# Patient Record
Sex: Female | Born: 1935 | Race: White | Hispanic: No | Marital: Married | Smoking: Former smoker
Health system: Southern US, Community
[De-identification: ages and names within clinical notes are randomized; demographics above are authoritative.]

## PROBLEM LIST (undated history)

## (undated) DIAGNOSIS — I2510 Atherosclerotic heart disease of native coronary artery without angina pectoris: Secondary | ICD-10-CM

## (undated) DIAGNOSIS — M545 Low back pain, unspecified: Secondary | ICD-10-CM

## (undated) DIAGNOSIS — E119 Type 2 diabetes mellitus without complications: Secondary | ICD-10-CM

## (undated) DIAGNOSIS — I10 Essential (primary) hypertension: Secondary | ICD-10-CM

## (undated) DIAGNOSIS — E7800 Pure hypercholesterolemia, unspecified: Secondary | ICD-10-CM

## (undated) DIAGNOSIS — E039 Hypothyroidism, unspecified: Secondary | ICD-10-CM

## (undated) HISTORY — DX: Pure hypercholesterolemia, unspecified: E78.00

## (undated) HISTORY — DX: Type 2 diabetes mellitus without complications: E11.9

## (undated) HISTORY — DX: Atherosclerotic heart disease of native coronary artery without angina pectoris: I25.10

## (undated) HISTORY — DX: Low back pain: M54.5

## (undated) HISTORY — DX: Hypothyroidism, unspecified: E03.9

## (undated) HISTORY — DX: Low back pain, unspecified: M54.50

## (undated) HISTORY — PX: CHOLECYSTECTOMY: SHX55

## (undated) HISTORY — PX: TONSILLECTOMY AND ADENOIDECTOMY: SHX28

## (undated) HISTORY — DX: Essential (primary) hypertension: I10

---

## 1997-12-25 ENCOUNTER — Encounter: Payer: Self-pay | Admitting: Neurosurgery

## 1997-12-26 ENCOUNTER — Ambulatory Visit (HOSPITAL_COMMUNITY): Admission: RE | Admit: 1997-12-26 | Discharge: 1997-12-26 | Payer: Self-pay | Admitting: Neurosurgery

## 1997-12-26 ENCOUNTER — Encounter: Payer: Self-pay | Admitting: Neurosurgery

## 1999-03-01 HISTORY — PX: CORONARY ANGIOPLASTY: SHX604

## 1999-03-31 ENCOUNTER — Inpatient Hospital Stay (HOSPITAL_COMMUNITY): Admission: EM | Admit: 1999-03-31 | Discharge: 1999-04-02 | Payer: Self-pay | Admitting: Emergency Medicine

## 1999-03-31 ENCOUNTER — Encounter: Payer: Self-pay | Admitting: Emergency Medicine

## 2000-10-10 ENCOUNTER — Encounter (HOSPITAL_BASED_OUTPATIENT_CLINIC_OR_DEPARTMENT_OTHER): Payer: Self-pay | Admitting: Internal Medicine

## 2000-10-10 ENCOUNTER — Encounter: Admission: RE | Admit: 2000-10-10 | Discharge: 2000-10-10 | Payer: Self-pay | Admitting: Internal Medicine

## 2000-12-28 ENCOUNTER — Other Ambulatory Visit: Admission: RE | Admit: 2000-12-28 | Discharge: 2000-12-28 | Payer: Self-pay | Admitting: Gastroenterology

## 2000-12-28 ENCOUNTER — Encounter (INDEPENDENT_AMBULATORY_CARE_PROVIDER_SITE_OTHER): Payer: Self-pay

## 2001-12-14 ENCOUNTER — Encounter (HOSPITAL_BASED_OUTPATIENT_CLINIC_OR_DEPARTMENT_OTHER): Payer: Self-pay | Admitting: Internal Medicine

## 2001-12-14 ENCOUNTER — Encounter: Admission: RE | Admit: 2001-12-14 | Discharge: 2001-12-14 | Payer: Self-pay | Admitting: Internal Medicine

## 2002-03-08 ENCOUNTER — Encounter: Admission: RE | Admit: 2002-03-08 | Discharge: 2002-03-08 | Payer: Self-pay | Admitting: Internal Medicine

## 2002-03-08 ENCOUNTER — Encounter (HOSPITAL_BASED_OUTPATIENT_CLINIC_OR_DEPARTMENT_OTHER): Payer: Self-pay | Admitting: Internal Medicine

## 2004-02-29 HISTORY — PX: CATARACT EXTRACTION: SUR2

## 2004-03-22 ENCOUNTER — Ambulatory Visit (HOSPITAL_COMMUNITY): Admission: RE | Admit: 2004-03-22 | Discharge: 2004-03-22 | Payer: Self-pay | Admitting: Internal Medicine

## 2004-12-28 ENCOUNTER — Ambulatory Visit (HOSPITAL_COMMUNITY): Admission: RE | Admit: 2004-12-28 | Discharge: 2004-12-28 | Payer: Self-pay | Admitting: Ophthalmology

## 2008-07-09 ENCOUNTER — Ambulatory Visit: Payer: Self-pay | Admitting: Diagnostic Radiology

## 2008-07-09 ENCOUNTER — Emergency Department (HOSPITAL_BASED_OUTPATIENT_CLINIC_OR_DEPARTMENT_OTHER): Admission: EM | Admit: 2008-07-09 | Discharge: 2008-07-09 | Payer: Self-pay | Admitting: Emergency Medicine

## 2010-05-26 ENCOUNTER — Other Ambulatory Visit: Payer: Self-pay | Admitting: Cardiovascular Disease

## 2010-05-27 NOTE — Telephone Encounter (Signed)
[facility] °

## 2010-05-27 NOTE — Telephone Encounter (Signed)
Pt was advised on last refill to make an appt.  This was not done; refill denied

## 2010-07-16 NOTE — Op Note (Signed)
[place]. [facility]  Patient:    Kathy Turner                        MRN: [id number] Proc. Date: 03/31/99 Adm. Date:  [id number] Disc. Date: [id number] Attending:  Koren Bound                           Operative Report  PROCEDURE:  Left heart catheterization with PTCA and stenting of the right coronary artery.  HISTORY: [name] is a middle-aged female who presented to the emergency room with acute chest pain.  She was found to have EKG changes, consistent with acute inferior wall myocardial infarction.  She was referred to the cardiac catheterization laboratory for further evaluation.  The procedure was left heart catheterization, with coronary angiography, with PTCA and stenting of the right coronary artery.  The right femoral artery was easily cannulated, using the modified Seldinger technique.  HEMODYNAMICS:  The LV pressure was 119/29, with an aortic pressure of 118/69.  ANGIOGRAPHY: 1. The left main coronary artery is relatively smooth and normal. 2. The left anterior descending artery has minor to moderate irregularities.    There are mid-irregularities between 10 and 30%.  The first diagonal    vessel is relatively unremarkable.  The LAD reaches to the front of 3. The left circumflex artery is a moderate-size vessel.  There are mild to    moderate irregularities between 20 and 30%.  It gives off a large    posterolateral branch. 4. The right coronary artery is seen to be an extremely large vessel and is    occluded at its midpoint.  PTCA:  The patient had been given 5000 units of heparin in the emergency room. An ACT revealed that she was adequately anticoagulated.  She was given a double bolus Integrilin drip.  The right coronary artery was engaged using a JR-4 guide.  A ______ angioplasty  wire was used to wire the RCA.  A 3.5 x 20 mm Cross Sail balloon was passed down across the stenosis.  Four inflations were  performed:  Four atmospheres for 42 seconds, five atmospheres for 7 minutes and 58 seconds, five atmospheres for 7 minutes and 58 seconds, five atmospheres for 3 minutes, and eight atmospheres for 45 seconds.  The patient had some bradycardia and hypotension, which got better with atropine and a brief infusion of dopamine.  Subsequent angiography revealed a widely patent vessel, but with some persistent irregularities.  At that point, a 3.5 x 25 mm [facility] stent was positioned down across the mid-RCA.  It was deployed at 13 atmospheres for 58 seconds.  This resulted in a nice angiographic lumen with no evidence of edge dissection.  The  stent seemed to be slightly undersized for the proximal aspect of the vessel. he Cross Sail balloon was again positioned across the proximal edge of the stent and was inflated up 16 atmospheres for 32 seconds.  This resulted in a nice angiographic lumen, with some slight flare in the proximal aspect of the stent.   The left ventriculogram was performed following the angioplasty procedure.  It reveals akinesis of the inferior wall, with well-preserved contractility of the  remaining walls.  The ejection fraction is approximately 50-60%.  There is no mitral regurgitation.  COMPLICATIONS:  None.  CONCLUSIONS:  Acute inferior wall myocardial infarction, with a successful PTCA and stenting of the right coronary  artery. DD:  04/28/99 TD:  04/28/99 Job: [id number]

## 2010-07-16 NOTE — Cardiovascular Report (Signed)
[place]. [facility]  Patient:    Kathy Turner                        MRN: [id number] Adm. Date:  [id number] Disc. Date: [id number] Attending:  Koren Bound                        Cardiac Catheterization  NO DICTATION. DD:  04/28/99 TD:  04/28/99 Job: [id number]

## 2010-07-16 NOTE — Discharge Summary (Signed)
. [facility]  Patient:    Kathy Turner                        MRN: [id number] Adm. Date:  [id number] Disc. Date: [id number] Attending:  Koren Bound                           Discharge Summary  DISCHARGE DIAGNOSES: 1. Acute inferior wall myocardial infarction - status post  percutaneous transluminal coronary angioplasty and stenting. 2. Hypertension. 3. Hypercholesterolemia. 4. Obesity.  DISCHARGE MEDICATIONS: 1. Enteric coated aspirin 325 mg a day. 2. Plavix 75 mg a day for 25 days. 3. Nitroglycerin 0.4 mg sublingual as needed. 4. Atenolol 25 mg twice a day. 5. Lipitor 20 mg q.h.s. 6. Lotensin 10 mg a day.  DISPOSITION:  The patient is to eat a low-fat, low-cholesterol diet.  She is to  watch for any signs of bleeding.  She is to see Alvia Grove., M.D., in the office in two weeks.  HISTORY OF PRESENT ILLNESS:  Kathy Turner is a 77 year old white female with a history of hypertension and hypercholesterolemia. She presented on March 31, 1999, with symptoms consistent with myocardial infarction.  She was  taken emergently to the catheterization lab for further evaluation.  HOSPITAL COURSE BY PROBLEM:  CORONARY ARTERY DISEASE.  The patient was taken to the catheterization lab where she was found to have an occluded right coronary artery. She underwent successful PTCA and stenting of her right coronary artery.  She had placement of a 3.5 mm x 25 mm [place]er Royal Stent deployed up to 3.9 mm in diameter. She had a 0% residual stenosis at the end.  She did have some transient hypotension which resolved promptly.  She did quite well and was able to walk and do her usual daily activity without any significant chest pain or dizziness.  She will be discharged on the above noted medications.  She will see Dr. Elease Hashimoto in two weeks. I have encouraged her to get a regular medical doctor. DD:  04/02/99 TD:  04/03/99 Job:  [id number]

## 2011-01-21 ENCOUNTER — Ambulatory Visit (HOSPITAL_COMMUNITY)
Admission: RE | Admit: 2011-01-21 | Discharge: 2011-01-21 | Disposition: A | Discharge status: Home / Self Care | Payer: Medicare Other | Source: Ambulatory Visit | Attending: Gastroenterology | Admitting: Gastroenterology

## 2011-01-21 DIAGNOSIS — M79609 Pain in unspecified limb: Secondary | ICD-10-CM

## 2011-01-21 DIAGNOSIS — M7989 Other specified soft tissue disorders: Secondary | ICD-10-CM

## 2011-05-16 ENCOUNTER — Encounter: Payer: Self-pay | Admitting: *Deleted

## 2011-08-02 ENCOUNTER — Encounter: Payer: Self-pay | Admitting: Cardiovascular Disease

## 2011-08-03 ENCOUNTER — Encounter: Payer: Self-pay | Admitting: Cardiovascular Disease

## 2012-06-29 ENCOUNTER — Encounter: Payer: Self-pay | Admitting: Cardiovascular Disease

## 2020-09-27 ENCOUNTER — Other Ambulatory Visit: Payer: Self-pay

## 2020-09-27 ENCOUNTER — Encounter (HOSPITAL_COMMUNITY): Payer: Self-pay | Admitting: Emergency Medicine

## 2020-09-27 ENCOUNTER — Inpatient Hospital Stay (HOSPITAL_COMMUNITY)
Admission: EM | Admit: 2020-09-27 | Discharge: 2020-10-29 | DRG: 178 | Disposition: E | Payer: Medicare HMO | Attending: Internal Medicine | Admitting: Internal Medicine

## 2020-09-27 ENCOUNTER — Emergency Department (HOSPITAL_COMMUNITY): Payer: Medicare HMO

## 2020-09-27 DIAGNOSIS — E7800 Pure hypercholesterolemia, unspecified: Secondary | ICD-10-CM | POA: Diagnosis present

## 2020-09-27 DIAGNOSIS — I959 Hypotension, unspecified: Secondary | ICD-10-CM | POA: Diagnosis present

## 2020-09-27 DIAGNOSIS — E669 Obesity, unspecified: Secondary | ICD-10-CM | POA: Diagnosis present

## 2020-09-27 DIAGNOSIS — E039 Hypothyroidism, unspecified: Secondary | ICD-10-CM | POA: Diagnosis present

## 2020-09-27 DIAGNOSIS — I10 Essential (primary) hypertension: Secondary | ICD-10-CM | POA: Diagnosis present

## 2020-09-27 DIAGNOSIS — Z9049 Acquired absence of other specified parts of digestive tract: Secondary | ICD-10-CM

## 2020-09-27 DIAGNOSIS — E860 Dehydration: Secondary | ICD-10-CM | POA: Diagnosis present

## 2020-09-27 DIAGNOSIS — E1169 Type 2 diabetes mellitus with other specified complication: Secondary | ICD-10-CM

## 2020-09-27 DIAGNOSIS — Z888 Allergy status to other drugs, medicaments and biological substances status: Secondary | ICD-10-CM

## 2020-09-27 DIAGNOSIS — E119 Type 2 diabetes mellitus without complications: Secondary | ICD-10-CM | POA: Diagnosis present

## 2020-09-27 DIAGNOSIS — Z7984 Long term (current) use of oral hypoglycemic drugs: Secondary | ICD-10-CM | POA: Diagnosis not present

## 2020-09-27 DIAGNOSIS — N179 Acute kidney failure, unspecified: Secondary | ICD-10-CM | POA: Diagnosis present

## 2020-09-27 DIAGNOSIS — I480 Paroxysmal atrial fibrillation: Secondary | ICD-10-CM | POA: Diagnosis present

## 2020-09-27 DIAGNOSIS — I4891 Unspecified atrial fibrillation: Secondary | ICD-10-CM

## 2020-09-27 DIAGNOSIS — D61818 Other pancytopenia: Secondary | ICD-10-CM | POA: Diagnosis present

## 2020-09-27 DIAGNOSIS — R531 Weakness: Secondary | ICD-10-CM | POA: Diagnosis not present

## 2020-09-27 DIAGNOSIS — Z79890 Hormone replacement therapy (postmenopausal): Secondary | ICD-10-CM

## 2020-09-27 DIAGNOSIS — Z7982 Long term (current) use of aspirin: Secondary | ICD-10-CM | POA: Diagnosis not present

## 2020-09-27 DIAGNOSIS — G9340 Encephalopathy, unspecified: Secondary | ICD-10-CM | POA: Diagnosis not present

## 2020-09-27 DIAGNOSIS — R778 Other specified abnormalities of plasma proteins: Secondary | ICD-10-CM | POA: Diagnosis present

## 2020-09-27 DIAGNOSIS — Z885 Allergy status to narcotic agent status: Secondary | ICD-10-CM

## 2020-09-27 DIAGNOSIS — I2510 Atherosclerotic heart disease of native coronary artery without angina pectoris: Secondary | ICD-10-CM | POA: Diagnosis present

## 2020-09-27 DIAGNOSIS — A419 Sepsis, unspecified organism: Secondary | ICD-10-CM

## 2020-09-27 DIAGNOSIS — Z79899 Other long term (current) drug therapy: Secondary | ICD-10-CM | POA: Diagnosis not present

## 2020-09-27 DIAGNOSIS — Z87891 Personal history of nicotine dependence: Secondary | ICD-10-CM

## 2020-09-27 DIAGNOSIS — Z66 Do not resuscitate: Secondary | ICD-10-CM | POA: Diagnosis present

## 2020-09-27 DIAGNOSIS — Z85118 Personal history of other malignant neoplasm of bronchus and lung: Secondary | ICD-10-CM

## 2020-09-27 DIAGNOSIS — C3490 Malignant neoplasm of unspecified part of unspecified bronchus or lung: Secondary | ICD-10-CM | POA: Diagnosis present

## 2020-09-27 DIAGNOSIS — R6520 Severe sepsis without septic shock: Secondary | ICD-10-CM

## 2020-09-27 DIAGNOSIS — U071 COVID-19: Secondary | ICD-10-CM | POA: Diagnosis present

## 2020-09-27 DIAGNOSIS — Z853 Personal history of malignant neoplasm of breast: Secondary | ICD-10-CM

## 2020-09-27 DIAGNOSIS — Z8249 Family history of ischemic heart disease and other diseases of the circulatory system: Secondary | ICD-10-CM

## 2020-09-27 DIAGNOSIS — R0681 Apnea, not elsewhere classified: Secondary | ICD-10-CM | POA: Diagnosis not present

## 2020-09-27 DIAGNOSIS — F0390 Unspecified dementia without behavioral disturbance: Secondary | ICD-10-CM | POA: Diagnosis present

## 2020-09-27 DIAGNOSIS — G3184 Mild cognitive impairment, so stated: Secondary | ICD-10-CM | POA: Insufficient documentation

## 2020-09-27 DIAGNOSIS — Z515 Encounter for palliative care: Secondary | ICD-10-CM | POA: Diagnosis not present

## 2020-09-27 DIAGNOSIS — R0602 Shortness of breath: Secondary | ICD-10-CM | POA: Diagnosis not present

## 2020-09-27 DIAGNOSIS — Z833 Family history of diabetes mellitus: Secondary | ICD-10-CM

## 2020-09-27 DIAGNOSIS — R4701 Aphasia: Secondary | ICD-10-CM

## 2020-09-27 DIAGNOSIS — R053 Chronic cough: Secondary | ICD-10-CM | POA: Diagnosis present

## 2020-09-27 DIAGNOSIS — Z789 Other specified health status: Secondary | ICD-10-CM | POA: Diagnosis not present

## 2020-09-27 DIAGNOSIS — E876 Hypokalemia: Secondary | ICD-10-CM

## 2020-09-27 DIAGNOSIS — Z7189 Other specified counseling: Secondary | ICD-10-CM | POA: Diagnosis not present

## 2020-09-27 DIAGNOSIS — Z9861 Coronary angioplasty status: Secondary | ICD-10-CM

## 2020-09-27 LAB — PROTIME-INR
INR: 1.1 (ref 0.8–1.2)
Prothrombin Time: 14.3 seconds (ref 11.4–15.2)

## 2020-09-27 LAB — URINALYSIS, ROUTINE W REFLEX MICROSCOPIC
Bacteria, UA: NONE SEEN
Bilirubin Urine: NEGATIVE
Glucose, UA: NEGATIVE mg/dL
Hgb urine dipstick: NEGATIVE
Ketones, ur: NEGATIVE mg/dL
Nitrite: NEGATIVE
Protein, ur: NEGATIVE mg/dL
Specific Gravity, Urine: 1.017 (ref 1.005–1.030)
pH: 5 (ref 5.0–8.0)

## 2020-09-27 LAB — COMPREHENSIVE METABOLIC PANEL
ALT: 7 U/L (ref 0–44)
AST: 16 U/L (ref 15–41)
Albumin: 2.5 g/dL — ABNORMAL LOW (ref 3.5–5.0)
Alkaline Phosphatase: 76 U/L (ref 38–126)
Anion gap: 6 (ref 5–15)
BUN: 15 mg/dL (ref 8–23)
CO2: 22 mmol/L (ref 22–32)
Calcium: 7.4 mg/dL — ABNORMAL LOW (ref 8.9–10.3)
Chloride: 109 mmol/L (ref 98–111)
Creatinine, Ser: 1.49 mg/dL — ABNORMAL HIGH (ref 0.44–1.00)
GFR, Estimated: 34 mL/min — ABNORMAL LOW (ref >60)
Glucose, Bld: 111 mg/dL — ABNORMAL HIGH (ref 70–99)
Potassium: 3.3 mmol/L — ABNORMAL LOW (ref 3.5–5.1)
Sodium: 137 mmol/L (ref 135–145)
Total Bilirubin: 0.3 mg/dL (ref 0.3–1.2)
Total Protein: 4.7 g/dL — ABNORMAL LOW (ref 6.5–8.1)

## 2020-09-27 LAB — PROCALCITONIN: Procalcitonin: <0.1 ng/mL

## 2020-09-27 LAB — LACTIC ACID, PLASMA
Lactic Acid, Venous: 0.9 mmol/L (ref 0.5–1.9)
Lactic Acid, Venous: 0.6 mmol/L (ref 0.5–1.9)

## 2020-09-27 LAB — CBC WITH DIFFERENTIAL/PLATELET
Abs Immature Granulocytes: 0.01 10*3/uL (ref 0.00–0.07)
Basophils Absolute: 0 10*3/uL (ref 0.0–0.1)
Basophils Relative: 0 %
Eosinophils Absolute: 0 10*3/uL (ref 0.0–0.5)
Eosinophils Relative: 0 %
HCT: 32.6 % — ABNORMAL LOW (ref 36.0–46.0)
Hemoglobin: 10.3 g/dL — ABNORMAL LOW (ref 12.0–15.0)
Immature Granulocytes: 0 %
Lymphocytes Relative: 22 %
Lymphs Abs: 0.6 10*3/uL — ABNORMAL LOW (ref 0.7–4.0)
MCH: 31.3 pg (ref 26.0–34.0)
MCHC: 31.6 g/dL (ref 30.0–36.0)
MCV: 99.1 fL (ref 80.0–100.0)
Monocytes Absolute: 0.3 10*3/uL (ref 0.1–1.0)
Monocytes Relative: 12 %
Neutro Abs: 1.8 10*3/uL (ref 1.7–7.7)
Neutrophils Relative %: 66 %
Platelets: 141 10*3/uL — ABNORMAL LOW (ref 150–400)
RBC: 3.29 MIL/uL — ABNORMAL LOW (ref 3.87–5.11)
RDW: 14 % (ref 11.5–15.5)
WBC: 2.8 10*3/uL — ABNORMAL LOW (ref 4.0–10.5)
nRBC: 0 % (ref 0.0–0.2)

## 2020-09-27 LAB — FIBRINOGEN: Fibrinogen: 434 mg/dL (ref 210–475)

## 2020-09-27 LAB — BRAIN NATRIURETIC PEPTIDE: B Natriuretic Peptide: 118.5 pg/mL — ABNORMAL HIGH (ref 0.0–100.0)

## 2020-09-27 LAB — RESP PANEL BY RT-PCR (FLU A&B, COVID) ARPGX2
Influenza A by PCR: NEGATIVE
Influenza B by PCR: NEGATIVE
SARS Coronavirus 2 by RT PCR: POSITIVE — AB

## 2020-09-27 LAB — FERRITIN: Ferritin: 578 ng/mL — ABNORMAL HIGH (ref 11–307)

## 2020-09-27 LAB — C-REACTIVE PROTEIN: CRP: 1.7 mg/dL — ABNORMAL HIGH (ref <1.0)

## 2020-09-27 LAB — TROPONIN I (HIGH SENSITIVITY)
Troponin I (High Sensitivity): 27 ng/L — ABNORMAL HIGH (ref <18)
Troponin I (High Sensitivity): 112 ng/L (ref <18)

## 2020-09-27 LAB — D-DIMER, QUANTITATIVE: D-Dimer, Quant: 2.05 ug/mL-FEU — ABNORMAL HIGH (ref 0.00–0.50)

## 2020-09-27 LAB — TRIGLYCERIDES: Triglycerides: 119 mg/dL (ref <150)

## 2020-09-27 LAB — LACTATE DEHYDROGENASE: LDH: 131 U/L (ref 98–192)

## 2020-09-27 LAB — MAGNESIUM: Magnesium: 1.4 mg/dL — ABNORMAL LOW (ref 1.7–2.4)

## 2020-09-27 MED ORDER — LACTATED RINGERS IV BOLUS
1000.0000 mL | Freq: Once | INTRAVENOUS | Status: AC
  Administered 2020-09-27: 1000 mL via INTRAVENOUS

## 2020-09-27 MED ORDER — LACTATED RINGERS IV SOLN: INTRAVENOUS | Status: DC

## 2020-09-27 MED ORDER — LACTATED RINGERS IV BOLUS
500.0000 mL | Freq: Once | INTRAVENOUS | Status: AC
  Administered 2020-09-27: 500 mL via INTRAVENOUS

## 2020-09-27 MED ORDER — SODIUM CHLORIDE 0.9 % IV SOLN
100.0000 mg | Freq: Every day | INTRAVENOUS | Status: DC
  Administered 2020-09-28: 100 mg via INTRAVENOUS
  Filled 2020-09-27: qty 20

## 2020-09-27 MED ORDER — SODIUM CHLORIDE 0.9 % IV SOLN
1.0000 g | Freq: Once | INTRAVENOUS | Status: AC
  Administered 2020-09-27: 1 g via INTRAVENOUS
  Filled 2020-09-27: qty 10

## 2020-09-27 MED ORDER — SODIUM CHLORIDE 0.9 % IV SOLN
500.0000 mg | Freq: Once | INTRAVENOUS | Status: AC
  Administered 2020-09-27: 500 mg via INTRAVENOUS
  Filled 2020-09-27: qty 500

## 2020-09-27 MED ORDER — POTASSIUM CHLORIDE CRYS ER 20 MEQ PO TBCR
20.0000 meq | EXTENDED_RELEASE_TABLET | Freq: Once | ORAL | Status: AC
  Administered 2020-09-28: 20 meq via ORAL
  Filled 2020-09-27: qty 1

## 2020-09-27 MED ORDER — SODIUM CHLORIDE 0.9 % IV SOLN
200.0000 mg | Freq: Once | INTRAVENOUS | Status: AC
  Administered 2020-09-27: 200 mg via INTRAVENOUS
  Filled 2020-09-27: qty 40

## 2020-09-27 NOTE — ED Notes (Signed)
PT chucks and brief changed. Meds given as ordered and labs drawn

## 2020-09-27 NOTE — ED Provider Notes (Signed)
[facility] EMERGENCY DEPARTMENT Provider Note  CSN: [id number] Arrival date & time: 09/26/2020 1436    History Chief Complaint  Patient presents with   Loss of Consciousness    X3     Kathy Turner is a 87 y.o. female with history of breast cancer, lung cancer, HTN, DM, HLD, dementia whose husband recently died suddenly that has moved in with her daughter who provides the history. Patient recently stopped Keytruda for her lung cancer as it was no longer effective. She was in her usual state of health recently but began to complain of weakness yesterday afternoon, She was lowered to the ground by her daughter who reports she was having trouble speaking. This was at 2pm yesterday. She eventually seemed to get better and was able to get up. She has had two more syncopal episodes earlier today, continues to have difficulty speaking. She has not had any fever, nasuea or vomiting. Has not eating or drank much today but had her usual appetite otherwise. Daughter noted her BP to be low, her HR to be labile. She was not complaining of any chest pains recently. She has chronic cough from her lung cancer, but no change.  After she was still doing poorly today the daughter called EMS, they found her to be in rapid afib and gave her a 10mg  bolus of diltiazem which improved her rate but her BP was then low.  Patient is unable to provide any useful history.  Per Daughter, patient is DNR.    Past Medical History:  Diagnosis Date   CAD (coronary artery disease)    DM (diabetes mellitus) ([place])    HTN (hypertension)    Hypercholesterolemia    Hypothyroidism    Low back pain     Past Surgical History:  Procedure Laterality Date   CATARACT EXTRACTION  2006   CHOLECYSTECTOMY     CORONARY ANGIOPLASTY  03/1999   TONSILLECTOMY AND ADENOIDECTOMY      Family History  Problem Relation Age of Onset   Diabetes Mother    Heart failure Mother     Social History   Tobacco Use   Smoking status:  Former    Types: Cigarettes   Smokeless tobacco: Never   Tobacco comments:    quit Jan 2001  Substance Use Topics   Alcohol use: Never     Home Medications Prior to Admission medications   Medication Sig Start Date End Date Taking? Authorizing Provider  aspirin 81 MG tablet Take 81 mg by mouth daily.    [provider]  atenolol (TENORMIN) 25 MG tablet TAKE 1 TABLET BY MOUTH TWICE A DAY 05/26/10   Nahser, Wonda Cheng, MD  dorzolamide-timolol (COSOPT) 22.3-6.8 MG/ML ophthalmic solution 1 drop 2 (two) times daily.    [provider]  furosemide (LASIX) 20 MG tablet Take 20 mg by mouth 2 (two) times daily.    [provider]  gemfibrozil (LOPID) 600 MG tablet Take 600 mg by mouth 2 (two) times daily before a meal.    [provider]  HYDROcodone-acetaminophen (VICODIN) 5-500 MG per tablet Take 1 tablet by mouth every 6 (six) hours as needed.    [provider]  levothyroxine (LEVOTHROID) 75 MCG tablet Take 1 tab on one day then 2 tabs 6 days of the week    [provider]  Lysine HCl (L-FORMULA LYSINE HCL) 500 MG TABS Take by mouth. Take 1 tab twice a day    [provider]  metFORMIN (  GLUCOPHAGE-XR) 500 MG 24 hr tablet Take 500 mg by mouth daily with breakfast.    [provider]  potassium chloride (K-DUR) 10 MEQ tablet Take 10 mEq by mouth once.    [provider]  ranitidine (ZANTAC) 150 MG capsule Take 150 mg by mouth 2 (two) times daily.    [provider]  simvastatin (ZOCOR) 40 MG tablet Take 40 mg by mouth every evening.    [provider]  spironolactone (ALDACTONE) 50 MG tablet Take 50 mg by mouth daily.    [provider]  vitamin E (VITAMIN E) 400 UNIT capsule Take 400 Units by mouth 2 (two) times daily.    [provider]     Allergies    Ramipril   Review of Systems   Review of Systems Unable to assess due to mental status.    Physical Exam BP 99/86    Pulse 71   Temp 99.2 F (37.3 C) (Oral)   Resp 15   SpO2 96%   Physical Exam Vitals and nursing note reviewed.  Constitutional:      Appearance: Normal appearance.  HENT:     Head: Normocephalic and atraumatic.     Nose: Nose normal.     Mouth/Throat:     Mouth: Mucous membranes are dry.  Eyes:     Extraocular Movements: Extraocular movements intact.     Conjunctiva/sclera: Conjunctivae normal.  Cardiovascular:     Rate and Rhythm: Normal rate.  Pulmonary:     Effort: Pulmonary effort is normal.     Breath sounds: Normal breath sounds.  Abdominal:     General: Abdomen is flat.     Palpations: Abdomen is soft.     Tenderness: There is no abdominal tenderness.  Musculoskeletal:        General: Normal range of motion.     Cervical back: Neck supple.     Right lower leg: Edema present.     Left lower leg: Edema present.  Skin:    General: Skin is warm and dry.  Neurological:     Mental Status: She is disoriented.     Cranial Nerves: No cranial nerve deficit.     Motor: Weakness (globally) present.     Comments: Expressive aphasia/garbled speech  Psychiatric:        Mood and Affect: Mood normal.     ED Results / Procedures / Treatments   Labs (all labs ordered are listed, but only abnormal results are displayed) Labs Reviewed  RESP PANEL BY RT-PCR (FLU A&B, COVID) ARPGX2 - Abnormal; Notable for the following components:      Result Value   SARS Coronavirus 2 by RT PCR POSITIVE (*)    All other components within normal limits  COMPREHENSIVE METABOLIC PANEL - Abnormal; Notable for the following components:   Potassium 3.3 (*)    Glucose, Bld 111 (*)    Creatinine, Ser 1.49 (*)    Calcium 7.4 (*)    Total Protein 4.7 (*)    Albumin 2.5 (*)    GFR, Estimated 34 (*)    All other components within normal limits  URINALYSIS, ROUTINE W REFLEX MICROSCOPIC - Abnormal; Notable for the following components:   APPearance CLOUDY (*)    Leukocytes,Ua SMALL (*)    All other  components within normal limits  CBC WITH DIFFERENTIAL/PLATELET - Abnormal; Notable for the following components:   WBC 2.8 (*)    RBC 3.29 (*)    Hemoglobin 10.3 (*)  HCT 32.6 (*)    Platelets 141 (*)    Lymphs Abs 0.6 (*)    All other components within normal limits  BRAIN NATRIURETIC PEPTIDE - Abnormal; Notable for the following components:   B Natriuretic Peptide 118.5 (*)    All other components within normal limits  TROPONIN I (HIGH SENSITIVITY) - Abnormal; Notable for the following components:   Troponin I (High Sensitivity) 27 (*)    All other components within normal limits  CULTURE, BLOOD (ROUTINE X 2)  CULTURE, BLOOD (ROUTINE X 2)  LACTIC ACID, PLASMA  PROTIME-INR  LACTIC ACID, PLASMA  LACTATE DEHYDROGENASE  FERRITIN  FIBRINOGEN  C-REACTIVE PROTEIN  D-DIMER, QUANTITATIVE  PROCALCITONIN  MAGNESIUM  TRIGLYCERIDES  TROPONIN I (HIGH SENSITIVITY)    EKG EKG Interpretation  Date/Time:  Sunday September 27 2020 14:52:26 EDT Ventricular Rate:  90 PR Interval:    QRS Duration: 100 QT Interval:  373 QTC Calculation: 457 R Axis:   75 Text Interpretation: Atrial fibrillation Consider anterior infarct Since last tracing Atrial fibrillation has replaced Normal sinus rhythm Confirmed by Calvert Cantor [phone]) on 09/01/2020 3:36:06 PM  Radiology CT Head Wo Contrast  Result Date: 09/12/2020 CLINICAL DATA:  Neuro deficit, acute, stroke suspected Syncopal episodes. EXAM: CT HEAD WITHOUT CONTRAST TECHNIQUE: Contiguous axial images were obtained from the base of the skull through the vertex without intravenous contrast. COMPARISON:  None. FINDINGS: Brain: No hemorrhage. No evidence of acute ischemia. Moderate generalized atrophy. Periventricular white matter hypodensity typical of chronic small vessel ischemia, moderate for age. 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 Scattered calcifications in the right cerebral hemisphere suggesting prior infection or  inflammatory process. No subdural or extra-axial collection. No midline shift or mass lesion/mass effect. Basilar cisterns are patent. Partially empty sella. Vascular: No hyperdense vessel.  Mild basilar Dolichoectasia. Skull: No fracture or focal lesion. Sinuses/Orbits: Defect of the right lamina papyracea no herniation of fat appears chronic, there is no adjacent inflammation. Bilateral cataract resection. No mastoid effusion. Other: None. IMPRESSION: 1. No acute intracranial abnormality. 2. Generalized atrophy and chronic small vessel ischemia. 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 3. Scattered calcifications in the right cerebral hemisphere suggesting prior infection or inflammatory process. Electronically Signed   By: Keith Rake M.D.   On: 09/16/2020 17:46   DG Chest Port 1 View  Result Date: 09/22/2020 CLINICAL DATA:  Syncope, shortness of breath EXAM: PORTABLE CHEST 1 VIEW COMPARISON:  None. FINDINGS: Right Port-A-Cath is in place with the tip at the cavoatrial junction. Heart is borderline in size. Diffuse interstitial prominence and perihilar/lower lobe opacities. No effusions. No acute bony abnormality. IMPRESSION: Diffuse interstitial prominence and perihilar opacities could reflect edema or infection. Electronically Signed   By: Rolm Baptise M.D.   On: 09/21/2020 15:18    Procedures .Critical Care  Date/Time: 09/02/2020 8:58 PM Performed by: Truddie Hidden, MD Authorized by: Truddie Hidden, MD   Critical care provider statement:    Critical care time (minutes):  45   Critical care time was exclusive of:  Separately billable procedures and treating other patients   Critical care was necessary to treat or prevent imminent or life-threatening deterioration of the following conditions:  Sepsis and shock   Critical care was time spent personally by me on the following activities:  Discussions with consultants, evaluation of patient's response to treatment,  examination of patient, ordering and performing treatments and interventions, ordering and review of laboratory studies, ordering and review of radiographic  studies, pulse oximetry, re-evaluation of patient's condition, obtaining history from patient or surrogate and review of old charts   Care discussed with: admitting provider    Medications Ordered in the ED Medications  lactated ringers infusion ( Intravenous New Bag/Given 09/06/2020 2014)  remdesivir 200 mg in sodium chloride 0.9% 250 mL IVPB (has no administration in time range)    Followed by  remdesivir 100 mg in sodium chloride 0.9 % 100 mL IVPB (has no administration in time range)  potassium chloride SA (KLOR-CON) CR tablet 20 mEq (has no administration in time range)  lactated ringers bolus 1,000 mL (0 mLs Intravenous Stopped 09/12/2020 1552)  cefTRIAXone (ROCEPHIN) 1 g in sodium chloride 0.9 % 100 mL IVPB (0 g Intravenous Stopped 09/26/2020 1921)  azithromycin (ZITHROMAX) 500 mg in sodium chloride 0.9 % 250 mL IVPB (0 mg Intravenous Stopped 09/14/2020 1933)  lactated ringers bolus 500 mL (500 mLs Intravenous New Bag/Given 09/11/2020 2018)     MDM Rules/Calculators/A&P MDM Patient with new onset afib has not converted back to NSR on monitor. Patient's daughter is managing meds and is sure she hasn't gotten any extra, actually hasn't had metoprolol in 2 days. She has new onset expressive aphasia, however symptoms started at 2pm yesterday, so outside window for Code Stroke and not a candidate for tPA. BP has been persistently low in the ED even after an initial LR bolus, will give one additional and reassess. Consider sepsis, hypovolemia, cardiogenic. Labs and imaging pending.   ED Course  I have reviewed the triage vital signs and the nursing notes.  Pertinent labs & imaging results that were available during my care of the patient were reviewed by me and considered in my medical decision making (see chart for details).  Clinical Course as of  09/21/2020 2058  Nancy Fetter Sep 27, 2020  1637 CMP shows increased Cr from baseline 0.96 at [place] recently.  [CS]  0488 CBC shows lymphopenia, last WBC at [facility] 5.8. Given low BP and low WBC with CXR concerning for infiltrate, will initiate IV Abx. Continue with IVF resuscitation.  [CS]  1638 Lactic acid is neg.  [CS]  8916 BP is improving.  [CS]  9450 UA with some WBC, already getting Abx. Will discuss with admitting team.  [CS]  [phone] Per Daughter patient is DNR.  [CS]  2800 Covid is positive. Accounts for her CXR findings.  [CS]  [place] with Dr. Beryle Quant, hospitalist, who will evaluate for admission. Requests we add covid inflammatory markers, begin remdesivir.  [CS]    Clinical Course User Index [CS] Truddie Hidden, MD    Final Clinical Impression(s) / ED Diagnoses Final diagnoses:  COVID-19  Hypotension, unspecified hypotension type  Sepsis with encephalopathy without septic shock, due to unspecified organism [facility])  Atrial fibrillation, unspecified type [facility])    Rx / DC Orders ED Discharge Orders     None        Truddie Hidden, MD 09/25/2020 2059

## 2020-09-27 NOTE — ED Notes (Signed)
Report given to Garlan Fillers, RN

## 2020-09-27 NOTE — H&P (Signed)
History and Physical    Kathy Turner YFV:[id number] DOB: [birth year] DOA: 09/13/2020  PCP: Jettie Booze, NP   Patient coming from: Home  Chief Complaint: Weakness, low blood pressure, difficulty speaking  HPI: Kathy Turner is a 87 y.o. female with medical history significant for CAD, HTN, DMT2, hypothyroidism, Lung cancer followed by oncology, dementia who presents by EMS for evaluation of generalized weakness and low blood pressure.  Her husband died suddenly last month and patient is now living with her daughter.  Daughter reports that patient was recently taken off of Keytruda for her lung cancer due to it no longer being effective.  Ms. Murfin has had increased weakness and difficulty getting around the house since yesterday.  Daughter reports that she had lowered her to the floor yesterday afternoon due to profound weakness.  He also states that her mother's had difficulty forming words and expressing herself which was not her normal baseline level.  She seemed to get better after a short time of rest.  This morning she had worsening weakness and not able to get around on her own at all like she normally would.  Daughter took her blood pressure and noted it was 80/60 at home so EMS was called.  Patient has not had any fever, nausea vomiting or diarrhea.  She does not complain of chest pain.  She reports she does not drink much fluid yesterday but otherwise her appetite has been normal.  Daughter reports she has a chronic cough from her lung cancer which is unchanged and nonproductive.  Daughter states that her mother has been in the home and has not gone out except for 2 appointments at the oncology office with the last 1 being last Wednesday.  Daughter states that only her and her husband as well as patient's physical therapist and home health nurse come to the house to see her otherwise she has not been in public places. She has been vaccinated and boosted for COVID-19. When EMS first  arrived patient was found to have a low blood pressure and have atrial fibrillation with RVR with a heart rate reportedly in the 170s.  She was given 10 mg bolus of diltiazem and her heart rate improved and she was brought to the hospital.  Is a history of smoking but quit over 25 years ago.  No alcohol or illicit drug use.  ED Course: In the emergency room patient was first noted to be in atrial fibrillation which spontaneously resolved.  Patient was noted to have a low blood pressure of 70-80/45-55 which improved with IVF hydration.  Code stroke was not initiated as patient had been in over 24 hours since symptoms began.  CT of the head showed no acute intracranial pathology.  He was given dose of Rocephin and azithromycin with mild diffuse social prominence in the lungs on chest x-ray.  Patient was found to be COVID-19 positive on testing.  Labs revealed a troponin of 27 and a BNP of 118.  Lactic acid 0.9.  WBCs 2800, hemoglobin 10.3, hematocrit 32.6, platelets 141,000.  Sodium 137, potassium 3.3, chloride 109, bicarb 22, creatinine 1.49, BUN 15, alkaline phosphatase 76, AST 16, ALT 7, glucose 111.  Patient was given remdesivir in the emergency room.  She has been maintaining O2 sats in the 96 to 98% range on room air and has not required any supplemental oxygen.  Hospitalist service been asked to admit for further management  Review of Systems:  Unable to obtain accurate review  of systems secondary to patient's MCI and expressive aphasia  Past Medical History:  Diagnosis Date   CAD (coronary artery disease)    DM (diabetes mellitus) ([place])    HTN (hypertension)    Hypercholesterolemia    Hypothyroidism    Low back pain     Past Surgical History:  Procedure Laterality Date   CATARACT EXTRACTION  2006   CHOLECYSTECTOMY     CORONARY ANGIOPLASTY  03/1999   TONSILLECTOMY AND ADENOIDECTOMY      Social History  reports that she has quit smoking. Her smoking use included cigarettes. She has  never used smokeless tobacco. She reports that she does not drink alcohol. No history on file for drug use.  Allergies  Allergen Reactions   Ramipril     cough    Family History  Problem Relation Age of Onset   Diabetes Mother    Heart failure Mother      Prior to Admission medications   Medication Sig Start Date End Date Taking? Authorizing Provider  aspirin 81 MG tablet Take 81 mg by mouth daily.    [provider]  atenolol (TENORMIN) 25 MG tablet TAKE 1 TABLET BY MOUTH TWICE A DAY 05/26/10   Nahser, Wonda Cheng, MD  dorzolamide-timolol (COSOPT) 22.3-6.8 MG/ML ophthalmic solution 1 drop 2 (two) times daily.    [provider]  furosemide (LASIX) 20 MG tablet Take 20 mg by mouth 2 (two) times daily.    [provider]  gemfibrozil (LOPID) 600 MG tablet Take 600 mg by mouth 2 (two) times daily before a meal.    [provider]  HYDROcodone-acetaminophen (VICODIN) 5-500 MG per tablet Take 1 tablet by mouth every 6 (six) hours as needed.    [provider]  levothyroxine (LEVOTHROID) 75 MCG tablet Take 1 tab on one day then 2 tabs 6 days of the week    [provider]  Lysine HCl (L-FORMULA LYSINE HCL) 500 MG TABS Take by mouth. Take 1 tab twice a day    [provider]  metFORMIN (GLUCOPHAGE-XR) 500 MG 24 hr tablet Take 500 mg by mouth daily with breakfast.    [provider]  potassium chloride (K-DUR) 10 MEQ tablet Take 10 mEq by mouth once.    [provider]  ranitidine (ZANTAC) 150 MG capsule Take 150 mg by mouth 2 (two) times daily.    [provider]  simvastatin (ZOCOR) 40 MG tablet Take 40 mg by mouth every evening.    [provider]  spironolactone (ALDACTONE) 50 MG tablet Take 50 mg by mouth daily.    [provider]  vitamin E (VITAMIN E) 400 UNIT capsule Take 400 Units by mouth 2 (two) times daily.    [provider]    Physical Exam: Vitals:   09/12/2020  1700 09/20/2020 1705 09/13/2020 1814 09/14/2020 1830  BP: (!) 87/51 (!) 100/46 (!) 107/51 99/86  Pulse: 71 71 68 71  Resp: 17 15 19 15   Temp:   99.2 F (37.3 C)   TempSrc:   Oral   SpO2: 93% 98% 97% 96%    Constitutional: NAD, calm, comfortable Vitals:   09/21/2020 1700 09/14/2020 1705 08/29/2020 1814 08/28/2020 1830  BP: (!) 87/51 (!) 100/46 (!) 107/51 99/86  Pulse: 71 71 68 71  Resp: 17 15 19 15   Temp:   99.2 F (37.3 C)   TempSrc:   Oral   SpO2: 93% 98% 97% 96%   General: WDWN, Alert and oriented  to self.  Eyes: EOMI, PERRL, conjunctivae normal.  Sclera nonicteric HENT:  [place]/AT, external ears normal.  Nares patent without epistasis.  Mucous membranes are dry. Posterior pharynx clear of any exudate or lesions.  Neck: Soft, normal range of motion, supple, no masses, trachea midline Respiratory: clear to auscultation bilaterally, no wheezing, no crackles. Normal respiratory effort. No accessory muscle use.  Cardiovascular: Regular rate and rhythm, no murmurs / rubs / gallops. Mild lower extremity edema. 2+ pedal pulses.  Abdomen: Soft, no tenderness, nondistended, no rebound or guarding.  No masses palpated. Bowel sounds normoactive Musculoskeletal: FROM. no cyanosis. No joint deformity upper and lower extremities.  Normal muscle tone.  Skin: Warm, dry, intact no rashes, lesions, ulcers. No induration Neurologic: CN 2-12 grossly intact. Slow garbled speech.  Sensation intact to touch. Grip strength 4/5 bilaterally Psychiatric:  Normal mood.    Labs on Admission: I have personally reviewed following labs and imaging studies  CBC: Recent Labs  Lab 09/07/2020 1506  WBC 2.8*  NEUTROABS 1.8  HGB 10.3*  HCT 32.6*  MCV 99.1  PLT 141*    Basic Metabolic Panel: Recent Labs  Lab 08/31/2020 1506  NA 137  K 3.3*  CL 109  CO2 22  GLUCOSE 111*  BUN 15  CREATININE 1.49*  CALCIUM 7.4*    GFR: CrCl cannot be calculated (Unknown ideal weight.).  Liver Function Tests: Recent Labs  Lab  09/04/2020 1506  AST 16  ALT 7  ALKPHOS 76  BILITOT 0.3  PROT 4.7*  ALBUMIN 2.5*    Urine analysis:    Component Value Date/Time   COLORURINE YELLOW 09/24/2020 1640   APPEARANCEUR CLOUDY (A) 09/16/2020 1640   LABSPEC 1.017 09/20/2020 1640   PHURINE 5.0 09/21/2020 1640   GLUCOSEU NEGATIVE 08/29/2020 1640   HGBUR NEGATIVE 09/15/2020 1640   BILIRUBINUR NEGATIVE 09/19/2020 1640   KETONESUR NEGATIVE 09/03/2020 1640   PROTEINUR NEGATIVE 09/11/2020 1640   NITRITE NEGATIVE 09/22/2020 1640   LEUKOCYTESUR SMALL (A) 09/26/2020 1640    Radiological Exams on Admission: CT Head Wo Contrast  Result Date: 09/01/2020 CLINICAL DATA:  Neuro deficit, acute, stroke suspected Syncopal episodes. EXAM: CT HEAD WITHOUT CONTRAST TECHNIQUE: Contiguous axial images were obtained from the base of the skull through the vertex without intravenous contrast. COMPARISON:  None. FINDINGS: Brain: No hemorrhage. No evidence of acute ischemia. Moderate generalized atrophy. Periventricular white matter hypodensity typical of chronic small vessel ischemia, moderate for age. 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 Scattered calcifications in the right cerebral hemisphere suggesting prior infection or inflammatory process. No subdural or extra-axial collection. No midline shift or mass lesion/mass effect. Basilar cisterns are patent. Partially empty sella. Vascular: No hyperdense vessel.  Mild basilar Dolichoectasia. Skull: No fracture or focal lesion. Sinuses/Orbits: Defect of the right lamina papyracea no herniation of fat appears chronic, there is no adjacent inflammation. Bilateral cataract resection. No mastoid effusion. Other: None. IMPRESSION: 1. No acute intracranial abnormality. 2. Generalized atrophy and chronic small vessel ischemia. 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 3. Scattered calcifications in the right cerebral hemisphere suggesting prior infection or inflammatory process.  Electronically Signed   By: Keith Rake M.D.   On: 09/06/2020 17:46   DG Chest Port 1 View  Result Date: 09/08/2020 CLINICAL DATA:  Syncope, shortness of breath EXAM: PORTABLE CHEST 1 VIEW COMPARISON:  None. FINDINGS: Right Port-A-Cath is in place with the tip at the cavoatrial junction. Heart is borderline in size. Diffuse interstitial prominence and perihilar/lower  lobe opacities. No effusions. No acute bony abnormality. IMPRESSION: Diffuse interstitial prominence and perihilar opacities could reflect edema or infection. Electronically Signed   By: Rolm Baptise M.D.   On: 09/23/2020 15:18    EKG: Independently reviewed.  EKG shows atrial fibrillation with controlled rate.  No acute ST elevation or depression.  QTc 457.  Patient is now in normal sinus rhythm on cardiac monitor.  Assessment/Plan Principal Problem:   COVID-19 virus infection Ms. Kvamme is admitted to Medical Telemetry floor under Covid Precautions.  She is started on remdesivir.  Maintaining O2 sats on room air and does not need supplemental oxygen at this time. Robitussin-DM provided for cough as needed Albuterol MDI with spacer as needed for shortness of breath Flutter valve every 2 hours while awake. RT to follow. COVID inflammatory labs are pending and will be monitored over the next few days Has mild leukopenia CBC will be rechecked in morning.  Active Problems:   Hypotension Blood pressure improved with IV fluid hydration.  Blood pressure is now stable Continue IV fluid hydration with LR at 100 ml/hr Daughter states that patient had low blood pressure of 80/60 this morning    Expressive aphasia Patient with difficulty expressing herself is different than her baseline according to daughter is at bedside.  She reports that patient is usually able to speak clearly and express her needs but since last night she has had difficulty expressing what she is wanting or thinking according to the daughter.  It is possible  the patient has had a small CVA secondary to COVID infection.  CT the head was negative for acute pathology.  MRI of the brain will be obtained to rule out CVA    Diabetes mellitus type 2 in obese  Blood sugars are controlled with metformin which will be continued. Check hemoglobin A1c    AF (paroxysmal atrial fibrillation)  Patient with atrial fibrillation on EKG which spontaneously resolved.  No history of atrial fibrillation per daughter. Continue to monitor on telemetry.  Possible atrial fibrillation is secondary to cardiac electrical instability from COVID infection.  Has further bouts of atrial fibrillation long-term anticoagulation will need to be discussed with patient and family to assess the risk versus benefit. Reportedly patient was in atrial fibrillation with RVR when EMS first assessed her and they were gave her 10 mg of IV Cardizem before arriving in the emergency room.  Blood pressure decreased after receiving IV Cardizem per report EMS gave to ER physician although reports patient had low blood pressure at home prior to EMS being called    Generalized weakness Consult physical therapy.  Fall precautions.     AKI IVF hydration with LR.  Monitor renal function and electrolytes with labs in am    Hypokalemia Check magnesium level and will replete if low.  Supplemental potassium provided.  Monitor electrolytes.     Dementia Chronic.    History of lung cancer Followed by oncology.  Recently was taken off of chemotherapy per daughter    DVT prophylaxis: Lovenox for DVT prophylaxis.   Code Status:   Full Code  Family Communication:  Diagnosis and plan discussed with patient and her daughter who is at bedside.  Daughter verbalized understanding and agrees with plan.  Questions answered.  Further recommendations to follow as clinically indicated Disposition Plan:   Patient is from:  Home  Anticipated DC to:  Home  Anticipated DC date:  Anticipate more than 2 midnight stay  in the hospital  Anticipated DC  barriers: No barriers to discharge identified at this time   Admission status:  Inpatient   Yevonne Aline Dathan Attia MD [facility]  How to contact the [facility] Attending or Consulting provider [place] or covering provider during after hours [place], for this patient?   Check the care team in [facility] and look for a) attending/consulting [facility] provider listed and b) the [facility] team listed Log into [email or web address] and use [place]'s universal password to access. If you do not have the password, please contact the hospital operator. Locate the [facility] provider you are looking for under [facility] and page to a number that you can be directly reached. If you still have difficulty reaching the provider, please page the [facility] (Director on Call) for the Hospitalists listed on amion for assistance.  09/05/2020, 7:58 PM

## 2020-09-27 NOTE — ED Notes (Signed)
Pt daughter would like to be notified when she is moved to a room

## 2020-09-27 NOTE — ED Triage Notes (Addendum)
Pt here via [facility] from home for syncopal episode x3. EMS found pt in afib RVR rate 170s, 100/70. EMS gave 15mng diltiazem and rate decreased to 105, BP decreased to 88/46. 200 ml NS given. Pt ao to self, no hx of afib. Hx of DM, MI, lung and breast CA. 20g LFA

## 2020-09-27 NOTE — ED Notes (Signed)
Patient transported to CT 

## 2020-09-28 ENCOUNTER — Inpatient Hospital Stay (HOSPITAL_COMMUNITY): Payer: Medicare HMO

## 2020-09-28 DIAGNOSIS — U071 COVID-19: Secondary | ICD-10-CM | POA: Diagnosis not present

## 2020-09-28 DIAGNOSIS — N179 Acute kidney failure, unspecified: Secondary | ICD-10-CM | POA: Diagnosis not present

## 2020-09-28 DIAGNOSIS — R531 Weakness: Secondary | ICD-10-CM

## 2020-09-28 DIAGNOSIS — I480 Paroxysmal atrial fibrillation: Secondary | ICD-10-CM | POA: Diagnosis not present

## 2020-09-28 DIAGNOSIS — Z789 Other specified health status: Secondary | ICD-10-CM

## 2020-09-28 DIAGNOSIS — R4701 Aphasia: Secondary | ICD-10-CM

## 2020-09-28 DIAGNOSIS — Z66 Do not resuscitate: Secondary | ICD-10-CM

## 2020-09-28 DIAGNOSIS — Z515 Encounter for palliative care: Secondary | ICD-10-CM

## 2020-09-28 DIAGNOSIS — G9340 Encephalopathy, unspecified: Secondary | ICD-10-CM

## 2020-09-28 DIAGNOSIS — Z85118 Personal history of other malignant neoplasm of bronchus and lung: Secondary | ICD-10-CM

## 2020-09-28 DIAGNOSIS — A419 Sepsis, unspecified organism: Secondary | ICD-10-CM

## 2020-09-28 DIAGNOSIS — I959 Hypotension, unspecified: Secondary | ICD-10-CM

## 2020-09-28 DIAGNOSIS — Z7189 Other specified counseling: Secondary | ICD-10-CM

## 2020-09-28 DIAGNOSIS — R6520 Severe sepsis without septic shock: Secondary | ICD-10-CM

## 2020-09-28 LAB — CBC
HCT: 35.9 % — ABNORMAL LOW (ref 36.0–46.0)
Hemoglobin: 11.4 g/dL — ABNORMAL LOW (ref 12.0–15.0)
MCH: 31.1 pg (ref 26.0–34.0)
MCHC: 31.8 g/dL (ref 30.0–36.0)
MCV: 98.1 fL (ref 80.0–100.0)
Platelets: 133 10*3/uL — ABNORMAL LOW (ref 150–400)
RBC: 3.66 MIL/uL — ABNORMAL LOW (ref 3.87–5.11)
RDW: 14.3 % (ref 11.5–15.5)
WBC: 2.8 10*3/uL — ABNORMAL LOW (ref 4.0–10.5)
nRBC: 0 % (ref 0.0–0.2)

## 2020-09-28 LAB — COMPREHENSIVE METABOLIC PANEL
ALT: 8 U/L (ref 0–44)
AST: 17 U/L (ref 15–41)
Albumin: 2.7 g/dL — ABNORMAL LOW (ref 3.5–5.0)
Alkaline Phosphatase: 80 U/L (ref 38–126)
Anion gap: 7 (ref 5–15)
BUN: 11 mg/dL (ref 8–23)
CO2: 25 mmol/L (ref 22–32)
Calcium: 8 mg/dL — ABNORMAL LOW (ref 8.9–10.3)
Chloride: 108 mmol/L (ref 98–111)
Creatinine, Ser: 1.1 mg/dL — ABNORMAL HIGH (ref 0.44–1.00)
GFR, Estimated: 49 mL/min — ABNORMAL LOW (ref >60)
Glucose, Bld: 95 mg/dL (ref 70–99)
Potassium: 3.5 mmol/L (ref 3.5–5.1)
Sodium: 140 mmol/L (ref 135–145)
Total Bilirubin: 0.2 mg/dL — ABNORMAL LOW (ref 0.3–1.2)
Total Protein: 5.3 g/dL — ABNORMAL LOW (ref 6.5–8.1)

## 2020-09-28 LAB — TSH: TSH: 3.439 u[IU]/mL (ref 0.350–4.500)

## 2020-09-28 LAB — D-DIMER, QUANTITATIVE: D-Dimer, Quant: 2.47 ug/mL-FEU — ABNORMAL HIGH (ref 0.00–0.50)

## 2020-09-28 LAB — TROPONIN I (HIGH SENSITIVITY)
Troponin I (High Sensitivity): 91 ng/L — ABNORMAL HIGH (ref <18)
Troponin I (High Sensitivity): 79 ng/L — ABNORMAL HIGH (ref <18)

## 2020-09-28 LAB — CBG MONITORING, ED: Glucose-Capillary: 92 mg/dL (ref 70–99)

## 2020-09-28 LAB — FERRITIN: Ferritin: 749 ng/mL — ABNORMAL HIGH (ref 11–307)

## 2020-09-28 LAB — C-REACTIVE PROTEIN: CRP: 1.7 mg/dL — ABNORMAL HIGH (ref <1.0)

## 2020-09-28 MED ORDER — HALOPERIDOL LACTATE 5 MG/ML IJ SOLN
2.0000 mg | Freq: Four times a day (QID) | INTRAMUSCULAR | Status: DC | PRN
  Administered 2020-09-28 – 2020-09-29 (×3): 2 mg via INTRAVENOUS
  Filled 2020-09-28 (×3): qty 1

## 2020-09-28 MED ORDER — ACETAMINOPHEN 325 MG PO TABS: 650.0000 mg | ORAL_TABLET | Freq: Four times a day (QID) | ORAL | Status: DC | PRN

## 2020-09-28 MED ORDER — GUAIFENESIN-DM 100-10 MG/5ML PO SYRP: 10.0000 mL | ORAL_SOLUTION | ORAL | Status: DC | PRN

## 2020-09-28 MED ORDER — SODIUM CHLORIDE 0.9 % IV SOLN: INTRAVENOUS | Status: DC

## 2020-09-28 MED ORDER — LORAZEPAM 2 MG/ML IJ SOLN
1.0000 mg | INTRAMUSCULAR | Status: DC | PRN
  Administered 2020-09-28 – 2020-09-29 (×7): 1 mg via INTRAVENOUS
  Filled 2020-09-28 (×8): qty 1

## 2020-09-28 MED ORDER — ACETAMINOPHEN 650 MG RE SUPP: 650.0000 mg | Freq: Four times a day (QID) | RECTAL | Status: DC | PRN

## 2020-09-28 MED ORDER — SIMVASTATIN 20 MG PO TABS: 40.0000 mg | ORAL_TABLET | Freq: Every evening | ORAL | Status: DC

## 2020-09-28 MED ORDER — MORPHINE SULFATE (PF) 2 MG/ML IV SOLN
2.0000 mg | INTRAVENOUS | Status: DC | PRN
Start: 2020-09-28 — End: 2020-09-29
  Administered 2020-09-28 – 2020-09-29 (×5): 2 mg via INTRAVENOUS
  Filled 2020-09-28 (×5): qty 1

## 2020-09-28 MED ORDER — BIOTENE DRY MOUTH MT LIQD
15.0000 mL | Freq: Two times a day (BID) | OROMUCOSAL | Status: DC
  Administered 2020-09-29 – 2020-09-30 (×2): 15 mL via OROMUCOSAL

## 2020-09-28 MED ORDER — LORAZEPAM 2 MG/ML IJ SOLN: 0.5000 mg | Freq: Four times a day (QID) | INTRAMUSCULAR | Status: DC | PRN

## 2020-09-28 MED ORDER — LEVOTHYROXINE SODIUM 88 MCG PO TABS: 88.0000 ug | ORAL_TABLET | ORAL | Status: DC

## 2020-09-28 MED ORDER — MAGNESIUM SULFATE 2 GM/50ML IV SOLN
2.0000 g | Freq: Once | INTRAVENOUS | Status: AC
  Administered 2020-09-28: 2 g via INTRAVENOUS
  Filled 2020-09-28: qty 50

## 2020-09-28 MED ORDER — DORZOLAMIDE HCL-TIMOLOL MAL 2-0.5 % OP SOLN
1.0000 [drp] | Freq: Two times a day (BID) | OPHTHALMIC | Status: DC
  Administered 2020-09-28 – 2020-09-30 (×5): 1 [drp] via OPHTHALMIC
  Filled 2020-09-28: qty 10

## 2020-09-28 MED ORDER — ONDANSETRON HCL 4 MG PO TABS: 4.0000 mg | ORAL_TABLET | Freq: Four times a day (QID) | ORAL | Status: DC | PRN

## 2020-09-28 MED ORDER — ONDANSETRON HCL 4 MG/2ML IJ SOLN: 4.0000 mg | Freq: Four times a day (QID) | INTRAMUSCULAR | Status: DC | PRN

## 2020-09-28 MED ORDER — LEVOTHYROXINE SODIUM 75 MCG PO TABS: 75.0000 ug | ORAL_TABLET | Freq: Every day | ORAL | Status: DC

## 2020-09-28 MED ORDER — GLYCOPYRROLATE 0.2 MG/ML IJ SOLN
0.2000 mg | INTRAMUSCULAR | Status: DC | PRN
  Administered 2020-09-29 – 2020-09-30 (×4): 0.2 mg via INTRAVENOUS
  Filled 2020-09-28 (×5): qty 1

## 2020-09-28 MED ORDER — LEVOTHYROXINE SODIUM 88 MCG PO TABS
176.0000 ug | ORAL_TABLET | ORAL | Status: DC
  Administered 2020-09-28: 176 ug via ORAL
  Filled 2020-09-28: qty 2

## 2020-09-28 MED ORDER — ALBUTEROL SULFATE HFA 108 (90 BASE) MCG/ACT IN AERS
2.0000 | INHALATION_SPRAY | RESPIRATORY_TRACT | Status: DC | PRN
  Filled 2020-09-28: qty 6.7

## 2020-09-28 MED ORDER — METFORMIN HCL ER 500 MG PO TB24
500.0000 mg | ORAL_TABLET | Freq: Every day | ORAL | Status: DC
  Administered 2020-09-28: 500 mg via ORAL
  Filled 2020-09-28: qty 1

## 2020-09-28 MED ORDER — LACTATED RINGERS IV SOLN: INTRAVENOUS | Status: DC

## 2020-09-28 MED ORDER — GLYCOPYRROLATE 0.2 MG/ML IJ SOLN: 0.1000 mg | Freq: Three times a day (TID) | INTRAMUSCULAR | Status: DC

## 2020-09-28 MED ORDER — MORPHINE SULFATE (PF) 2 MG/ML IV SOLN: 2.0000 mg | INTRAVENOUS | Status: DC | PRN

## 2020-09-28 MED ORDER — ASPIRIN EC 81 MG PO TBEC
81.0000 mg | DELAYED_RELEASE_TABLET | Freq: Every day | ORAL | Status: DC
  Administered 2020-09-28: 81 mg via ORAL
  Filled 2020-09-28: qty 1

## 2020-09-28 MED ORDER — ENOXAPARIN SODIUM 30 MG/0.3ML IJ SOSY
30.0000 mg | PREFILLED_SYRINGE | INTRAMUSCULAR | Status: DC
  Administered 2020-09-28: 30 mg via SUBCUTANEOUS
  Filled 2020-09-28: qty 0.3

## 2020-09-28 NOTE — ED Notes (Signed)
Patient transported to MRI 

## 2020-09-28 NOTE — Progress Notes (Signed)
PROGRESS NOTE    Kathy Turner  YIF:[id number] DOB: [birth year] DOA: 09/25/2020 PCP: Jettie Booze, NP   Chief Complain: Weakness, hypertension, aphasia  Brief Narrative: Patient is a 87 year old female with history of coronary artery disease, dementia,hypertension, diabetes type 2, hypothyroidism, lung cancer followed by oncology, dementia who presented from home for the evaluation of generalized weakness, low blood pressure.  She was found to be very weak at home, unable to ambulate.  She was also having difficulty finding words and expressing herself.  When her blood pressure was checked at home it was 80/60 so she was brought to the emergency department.  She was having poor oral intake at home.  When EMS arrived, she was severely hypotensive, EKG monitor showed A. fib with RVR, given 10 mg of diltiazem after which her heart rate improved.  On presentation, COVID screen test was positive.  EKG rhythm on presentation to ED was sinus.  She was found to be hypotensive.  CTA did not show any acute intracranial abnormalities.  Chest x-ray showed mild diffuse interstitial prominence in the lungs.  BNP was 119, troponin was 97.  Patient was started on remdesivir.  She was saturating fine on room air on arrival. Patient was admitted for the management of generalized weakness, COVID.  Palliative care consulted for goals of care discussion due to her multiple comorbidities, history of cancer, dementia. After long discussion with the daughter, she is interested to send her mom to [facility], started on comfort care.  Assessment & Plan:   Principal Problem:   COVID-19 virus infection Active Problems:   Hypotension   Expressive aphasia   Diabetes mellitus type 2 in obese (HCC)   AF (paroxysmal atrial fibrillation) (HCC)   Generalized weakness   History of lung cancer   Dementia (HCC)   AKI (acute kidney injury) ([place])   Hypokalemia   COVID infection: Presented with severe weakness, low  appetite.  Chest x-ray showed diffuse interstitial prominence and perihilar opacities could reflect edema/ infection.  She was given a dose of azithromycin, ceftriaxone in the emergency department.  Normal procalcitonin, so will not continue antibiotics.  Continue remdesivir for now.  She was not started on steroid because she was not hypoxic on presentation. Continue  albuterol inhaler, flutter valve, incentive spirometry.  We will not monitor and inflammatory markers, will not do blood work because the goal is for comfort. She is fully vaccinated including booster  Hypotension: Severely hypotensive on presentation.  Blood pressures improved with IV fluids.Continue gentle IV fluids for today until she goes to [facility]  Expressive aphasia: On baseline, her speech is normal.  CT head did not show any acute intracranial abnormalities.  MRI showed no acute MRI finding,age related atrophy,chronic small-vessel ischemic changes throughout the brain as outlined above,old small right temporal infarction.  Diabetes type 2: On metformin at home.   A. fib with RVR: When EMS arrived, she was in A. fib with RVR.  Her rhythm has spontaneously returned to normal sinus.   Elevated troponin: Flat trend, most likely associated with A. fib with RVR.  Denies any anginal symptoms.  Generalized weakness: Secondary to COVID infection.She is minimally ambulatory at baseline,sometimes ambulates with walker  AKI: Resolved with IV fluids  Pancytopenia: Most likely associated with history of malignancy, chemotherapy.  Continue to monitor  Hypokalemia/hypomagnesemia: Blood work will not be done now.  Dementia: Continue supportive care.    History of stage 4 lung cancer: Follows with oncology.  Recently taken off  from [address] [facility] She has also H.O right breast cancer. She follows with onc[facility]  Goals of care discussion:Multiple comorbidities including lung cancer, dementia.   Presented with aphasia, weakness secondary to COVID.  Daughter is interested on residential hospice placement.  She is interested on comfort care only.  TOC /palliaitive care consulted.       DVT prophylaxis:None Code Status: Comfort Family Communication: Called and discussed with daughter on phone Status is: Inpatient  Remains inpatient appropriate because:Inpatient level of care appropriate due to severity of illness  Dispo: The patient is from: Home              Anticipated d/c is to:  Residential Hospice              Patient currently is not medically stable to d/c.   Difficult to place patient No    Consultants: None  Procedures:None  Antimicrobials:  Anti-infectives (From admission, onward)    Start     Dose/Rate Route Frequency Ordered Stop   09/28/20 1000  remdesivir 100 mg in sodium chloride 0.9 % 100 mL IVPB       See Hyperspace for full Linked Orders Report.   100 mg 200 mL/hr over 30 Minutes Intravenous Daily 08/30/2020 1946 10/02/20 0959   09/18/2020 2000  remdesivir 200 mg in sodium chloride 0.9% 250 mL IVPB       See Hyperspace for full Linked Orders Report.   200 mg 580 mL/hr over 30 Minutes Intravenous Once 09/02/2020 1946 09/28/20 0019   09/10/2020 1700  cefTRIAXone (ROCEPHIN) 1 g in sodium chloride 0.9 % 100 mL IVPB        1 g 200 mL/hr over 30 Minutes Intravenous  Once 08/31/2020 1652 09/13/2020 1921   08/28/2020 1700  azithromycin (ZITHROMAX) 500 mg in sodium chloride 0.9 % 250 mL IVPB        500 mg 250 mL/hr over 60 Minutes Intravenous  Once 09/19/2020 1652 09/11/2020 1933       Subjective:  Patient seen and examined at the bedside this morning.  Hemodynamically stable during my evaluation.  Her blood pressure improved.  She was on 2 L of oxygen during my evaluation, which was later weaned off.  She did not look in respiratory distress.  She was not oriented, spoke few words which cannot be understood.  Objective: Vitals:   09/28/20 0730 09/28/20 0745 09/28/20  0800 09/28/20 0815  BP: (!) 128/55 120/63 (!) 116/44 124/74  Pulse: 80 79 77 87  Resp: 11 20 14 14   Temp:      TempSrc:      SpO2: 95% 96% 98% 94%    Intake/Output Summary (Last 24 hours) at 09/28/2020 0850 Last data filed at 08/28/2020 1437 Gross per 24 hour  Intake 200 ml  Output --  Net 200 ml   There were no vitals filed for this visit.  Examination:  General exam: Chronically ill looking, very deconditioned, debilitated, elderly female HEENT: PERRL Respiratory system: Diminished air sounds on the bases, no wheezes or crackles Cardiovascular system: S1 & S2 heard, RRR.  Gastrointestinal system: Abdomen is nondistended, soft and nontender. Central nervous system: Awake but not alert or oriented Extremities: No edema, no clubbing ,no cyanosis Skin: No rashes, no ulcers,no icterus       Data Reviewed: I have personally reviewed following labs and imaging studies  CBC: Recent Labs  Lab 09/01/2020 1506 09/28/20 0433  WBC 2.8* 2.8*  NEUTROABS 1.8  --  HGB 10.3* 11.4*  HCT 32.6* 35.9*  MCV 99.1 98.1  PLT 141* 875*   Basic Metabolic Panel: Recent Labs  Lab 09/10/2020 1506 09/24/2020 2020 09/28/20 0240  NA 137  --  140  K 3.3*  --  3.5  CL 109  --  108  CO2 22  --  25  GLUCOSE 111*  --  95  BUN 15  --  11  CREATININE 1.49*  --  1.10*  CALCIUM 7.4*  --  8.0*  MG  --  1.4*  --    GFR: CrCl cannot be calculated (Unknown ideal weight.). Liver Function Tests: Recent Labs  Lab 09/06/2020 1506 09/28/20 0240  AST 16 17  ALT 7 8  ALKPHOS 76 80  BILITOT 0.3 0.2*  PROT 4.7* 5.3*  ALBUMIN 2.5* 2.7*   No results for input(s): LIPASE, AMYLASE in the last 168 hours. No results for input(s): AMMONIA in the last 168 hours. Coagulation Profile: Recent Labs  Lab 09/10/2020 1506  INR 1.1   Cardiac Enzymes: No results for input(s): CKTOTAL, CKMB, CKMBINDEX, TROPONINI in the last 168 hours. BNP (last 3 results) No results for input(s): PROBNP in the last 8760  hours. HbA1C: No results for input(s): HGBA1C in the last 72 hours. CBG: Recent Labs  Lab 09/28/20 0755  GLUCAP 92   Lipid Profile: Recent Labs    09/18/2020 2020  TRIG 119   Thyroid Function Tests: Recent Labs    09/28/20 0233  TSH 3.439   Anemia Panel: Recent Labs    09/11/2020 2020 09/28/20 0240  FERRITIN 578* 749*   Sepsis Labs: Recent Labs  Lab 09/15/2020 1507 08/28/2020 2020  PROCALCITON  --  <0.10  LATICACIDVEN 0.9 0.6    Recent Results (from the past 240 hour(s))  Resp Panel by RT-PCR (Flu A&B, Covid) Nasopharyngeal Swab     Status: Abnormal   Collection Time: 08/28/2020  3:56 PM   Specimen: Nasopharyngeal Swab; Nasopharyngeal(NP) swabs in vial transport medium  Result Value Ref Range Status   SARS Coronavirus 2 by RT PCR POSITIVE (A) NEGATIVE Final    Comment: RESULT CALLED TO, READ BACK BY AND VERIFIED WITH: RN C.COBB ON [id number] AT 1851 BY E.P[place]ISH (NOTE) SARS-CoV-2 target nucleic acids are DETECTED.  The SARS-CoV-2 RNA is generally detectable in upper respiratory specimens during the acute phase of infection. Positive results are indicative of the presence of the identified virus, but do not rule out bacterial infection or co-infection with other pathogens not detected by the test. Clinical correlation with patient history and other diagnostic information is necessary to determine patient infection status. The expected result is Negative.  Fact Sheet for Patients: EntrepreneurPulse.com.au  Fact Sheet for Healthcare Providers: IncredibleEmployment.be  This test is not yet approved or cleared by the [place] FDA and  has been authorized for detection and/or diagnosis of SARS-CoV-2 by FDA under an Emergency Use Authorization (EUA).  This EUA will remain in effect (meaning this te st can be used) for the duration of  the COVID-19 declaration under Section 564(b)(1) of the Act, 21 U.S.C. section 360bbb-3(b)(1),  unless the authorization is terminated or revoked sooner.     Influenza A by PCR NEGATIVE NEGATIVE Final   Influenza B by PCR NEGATIVE NEGATIVE Final    Comment: (NOTE) The Xpert Xpress SARS-CoV-2/FLU/RSV plus assay is intended as an aid in the diagnosis of influenza from Nasopharyngeal swab specimens and should not be used as a sole basis for treatment. Nasal washings and aspirates are  unacceptable for Xpert Xpress SARS-CoV-2/FLU/RSV testing.  Fact Sheet for Patients: EntrepreneurPulse.com.au  Fact Sheet for Healthcare Providers: IncredibleEmployment.be  This test is not yet approved or cleared by the [place] FDA and has been authorized for detection and/or diagnosis of SARS-CoV-2 by FDA under an Emergency Use Authorization (EUA). This EUA will remain in effect (meaning this test can be used) for the duration of the COVID-19 declaration under Section 564(b)(1) of the Act, 21 U.S.C. section 360bbb-3(b)(1), unless the authorization is terminated or revoked.  Performed at [facility], [place] [address] [place], [place] [address]          Radiology Studies: CT Head Wo Contrast  Result Date: 09/20/2020 CLINICAL DATA:  Neuro deficit, acute, stroke suspected Syncopal episodes. EXAM: CT HEAD WITHOUT CONTRAST TECHNIQUE: Contiguous axial images were obtained from the base of the skull through the vertex without intravenous contrast. COMPARISON:  None. FINDINGS: Brain: No hemorrhage. No evidence of acute ischemia. Moderate generalized atrophy. Periventricular white matter hypodensity typical of chronic small vessel ischemia, moderate for age. 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 Scattered calcifications in the right cerebral hemisphere suggesting prior infection or inflammatory process. No subdural or extra-axial collection. No midline shift or mass lesion/mass effect. Basilar cisterns are patent. Partially empty sella.  Vascular: No hyperdense vessel.  Mild basilar Dolichoectasia. Skull: No fracture or focal lesion. Sinuses/Orbits: Defect of the right lamina papyracea no herniation of fat appears chronic, there is no adjacent inflammation. Bilateral cataract resection. No mastoid effusion. Other: None. IMPRESSION: 1. No acute intracranial abnormality. 2. Generalized atrophy and chronic small vessel ischemia. 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 3. Scattered calcifications in the right cerebral hemisphere suggesting prior infection or inflammatory process. Electronically Signed   By: Keith Rake M.D.   On: 09/03/2020 17:46   DG Chest Port 1 View  Result Date: 09/24/2020 CLINICAL DATA:  Syncope, shortness of breath EXAM: PORTABLE CHEST 1 VIEW COMPARISON:  None. FINDINGS: Right Port-A-Cath is in place with the tip at the cavoatrial junction. Heart is borderline in size. Diffuse interstitial prominence and perihilar/lower lobe opacities. No effusions. No acute bony abnormality. IMPRESSION: Diffuse interstitial prominence and perihilar opacities could reflect edema or infection. Electronically Signed   By: Rolm Baptise M.D.   On: 09/09/2020 15:18        Scheduled Meds:  aspirin EC  81 mg Oral Daily   dorzolamide-timolol  1 drop Both Eyes BID   enoxaparin (LOVENOX) injection  30 mg Subcutaneous Q24H   levothyroxine  176 mcg Oral Once per day on Mon Wed Fri   [START ON 09/29/2020] levothyroxine  88 mcg Oral Once per day on Sun Tue Thu Sat   metFORMIN  500 mg Oral Q breakfast   simvastatin  40 mg Oral QPM   Continuous Infusions:  remdesivir 100 mg in NS 100 mL       LOS: 1 day    Time spent: 35 mins.More than 50% of that time was spent in counseling and/or coordination of care.      Shelly Coss, MD [facility] P8/02/2020, 8:50 AM

## 2020-09-28 NOTE — ED Notes (Signed)
Pt provided breakfast tray, this RN assisting feeding. Pt ate 1/2 cup applesauce, one bite of pancake. Pt given water. Will continue to encourage food and fluids.

## 2020-09-28 NOTE — Progress Notes (Signed)
CSW received consult regarding COVID + hospice facility placement. CSW sent referral to [facility] to see if their [place] or [facility] facilities have [place] beds.   Gilmore Laroche, MSW, [facility]

## 2020-09-28 NOTE — ED Notes (Signed)
Pt gown changed, sheets changed and brief changed. Placed back on pure wick

## 2020-09-28 NOTE — Progress Notes (Addendum)
Brief Palliative Medicine Progress Note:  PMT consult received and completed. Full note to follow.   Recommendations/Plan: Continue full comfort measures Continue DNR/DNI as previously documented - durable DNR form completed and original placed in shadow chart. Copy made and will be scanned into Vynca/ACP tab [facility] does not accept patients COVID+ until off isolation. Patient's daughter was agreeable to [facility] - they do accept COVID patients. TOC consult placed.  Added orders for EOL symptom management and to reflect full comfort measures, as well as discontinued orders that were not focused on comfort Unrestricted visitation orders were placed per current [place] EOL visitation policy  Nursing to provide frequent assessments and administer PRN medications as clinically necessary to ensure EOL comfort PMT will continue to follow holistically    Thank you for allowing PMT to assist in the care of this patient.  Tegh Franek M. Tamala [facility] Palliative Medicine Team Team Phone: [phone] NO CHARGE

## 2020-09-28 NOTE — ED Notes (Signed)
Pt SpO2 intermittently briefly drops to high 80s-upper 90s, then jumps back up to 96% or above. Pt placed on 2L via [place]. SpO2 95% on room air at this time.

## 2020-09-28 NOTE — ED Notes (Signed)
MD messaged and asked for powder K+ to give instead of pills

## 2020-09-28 NOTE — ED Notes (Signed)
Placed Breakfast Order 

## 2020-09-28 DEATH — deceased

## 2020-09-29 DIAGNOSIS — R0681 Apnea, not elsewhere classified: Secondary | ICD-10-CM

## 2020-09-29 DIAGNOSIS — R0602 Shortness of breath: Secondary | ICD-10-CM

## 2020-09-29 MED ORDER — MORPHINE SULFATE (PF) 2 MG/ML IV SOLN
2.0000 mg | INTRAVENOUS | Status: DC | PRN
  Administered 2020-09-29: 2 mg via INTRAVENOUS
  Filled 2020-09-29: qty 2

## 2020-09-29 MED ORDER — MORPHINE 100MG IN NS 100ML (1MG/ML) PREMIX INFUSION
2.0000 mg/h | INTRAVENOUS | Status: DC
  Administered 2020-09-29: 2 mg/h via INTRAVENOUS
  Filled 2020-09-29: qty 100

## 2020-09-29 MED ORDER — MORPHINE BOLUS VIA INFUSION
1.0000 mg | INTRAVENOUS | Status: DC | PRN
  Administered 2020-09-29 (×3): 3 mg via INTRAVENOUS
  Filled 2020-09-29: qty 3

## 2020-09-29 MED ORDER — MORPHINE SULFATE (PF) 2 MG/ML IV SOLN
2.0000 mg | INTRAVENOUS | Status: DC
  Administered 2020-09-29: 2 mg via INTRAVENOUS
  Filled 2020-09-29: qty 1

## 2020-09-29 NOTE — TOC Initial Note (Signed)
Transition of Care [facility]) - Initial/Assessment Note    Patient Details  Name: Kathy Turner MRN: [id number] Date of Birth: 1932-03-20  Transition of Care [facility]) CM/SW Contact:    Benard Halsted, [place] Phone Number: 09/29/2020, 11:39 AM  Clinical Narrative:                 [facility] is able to accept patient today.   Expected Discharge Plan: [place] Barriers to Discharge: No Barriers Identified   Patient Goals and CMS Choice Patient states their goals for this hospitalization and ongoing recovery are:: Comfort CMS Medicare.gov Compare Post Acute Care list provided to:: Patient Represenative (must comment) Choice offered to / list presented to : Adult Children  Expected Discharge Plan and Services Expected Discharge Plan: [place] In-house Referral: Clinical Social Work, Hospice / [place] Acute Care Choice: Hospice Living arrangements for the past 2 months: Single Family Home Expected Discharge Date: 09/29/20                                    Prior Living Arrangements/Services Living arrangements for the past 2 months: Single Family Home Lives with:: Adult Children Patient language and need for interpreter reviewed:: Yes Do you feel safe going back to the place where you live?: Yes      Need for Family Participation in Patient Care: Yes (Comment) Care giver support system in place?: Yes (comment)   Criminal Activity/Legal Involvement Pertinent to Current Situation/Hospitalization: No - Comment as needed  Activities of Daily Living      Permission Sought/Granted Permission sought to share information with : Facility Sport and exercise psychologist, Family Supports Permission granted to share information with : No  Share Information with NAME: Penni Bombard  Permission granted to share info w AGENCY: Hospice  Permission granted to share info w Relationship: Daugher  Permission granted to share info w Contact Information:  [phone]  Emotional Assessment   Attitude/Demeanor/Rapport: Unable to Assess Affect (typically observed): Unable to Assess Orientation: : Oriented to Self Alcohol / Substance Use: Not Applicable Psych Involvement: No (comment)  Admission diagnosis:  Hypotension, unspecified hypotension type [I95.9] Atrial fibrillation, unspecified type ([place]) [I48.91] Sepsis with encephalopathy without septic shock, due to unspecified organism ([place]) [A41.9, R65.20, G93.40] COVID-19 virus infection [U07.1] COVID-19 [U07.1] Patient Active Problem List   Diagnosis Date Noted   COVID-19 virus infection 09/14/2020   Hypotension 09/26/2020   Expressive aphasia 08/30/2020   Diabetes mellitus type 2 in obese ([place]) 09/06/2020   AF (paroxysmal atrial fibrillation) ([place]) 09/01/2020   Generalized weakness 09/12/2020   MCI (mild cognitive impairment) 08/30/2020   History of lung cancer 09/16/2020   Dementia ([place]) 09/16/2020   AKI (acute kidney injury) ([place]) 09/17/2020   Hypokalemia 09/21/2020   PCP:  Jettie Booze, NP Pharmacy:   [id number] [place], [address] [place] [address] Phone: [phone] Fax: [phone]     Social Determinants of Health (SDOH) Interventions    Readmission Risk Interventions No flowsheet data found.

## 2020-09-29 NOTE — Consult Note (Signed)
Consultation Note Date: 09/29/2020   Patient Name: Kathy Turner  DOB: 03-Oct-1932  MRN: [id number]  Age / Sex: 87 y.o., female  PCP: Jettie Booze, NP Referring Physician: Shelly Coss, MD  Reason for Consultation: Disposition, Establishing goals of care, Non pain symptom management, Pain control, Psychosocial/spiritual support, and Terminal Care  HPI/Patient Profile: 87 y.o. female  with past medical history of coronary artery disease, dementia, hypertension, DM type 2, hypothyroidism, lung cancer/breast cancer presented to the ED on 09/04/2020 from home with complaints of syncopal events x3. Patient was admitted on 09/24/2020 with hypotension, COVID infection, expressive aphasia, generalized weakness, AKI. After conversations with daughter, she ultimately decided to pursue comfort only measures.  ED Course: In the emergency room patient was first noted to be in atrial fibrillation which spontaneously resolved.  Patient was noted to have a low blood pressure of 70-80/45-55 which improved with IVF hydration.  Code stroke was not initiated as patient had been in over 24 hours since symptoms began.  CT of the head showed no acute intracranial pathology.  He was given dose of Rocephin and azithromycin with mild diffuse social prominence in the lungs on chest x-ray.  Patient was found to be COVID-19 positive on testing.  Labs revealed a troponin of 27 and a BNP of 118.  Lactic acid 0.9.  WBCs 2800, hemoglobin 10.3, hematocrit 32.6, platelets 141,000.  Sodium 137, potassium 3.3, chloride 109, bicarb 22, creatinine 1.49, BUN 15, alkaline phosphatase 76, AST 16, ALT 7, glucose 111.  Patient was given remdesivir in the emergency room.  She has been maintaining O2 sats in the 96 to 98% range on room air and has not required any supplemental oxygen.  Hospitalist service been asked to admit for further management  Clinical  Assessment and Goals of Care: I have reviewed medical records including EPIC notes, labs, and imaging. Received report from primary RN - no acute concerns. RN reports patient is agitated and confused.    Went to visit patient at bedside - daughter/Jeannine present. Patient was lying in bed awake, alert, disoriented, and unable to participate in conversation. She was restless and irritable - had just pulled out/deaccessed her port when I arrived. No respiratory distress, increased work of breathing, or secretions noted.   Met with daughter/Jeannie  to discuss diagnosis, prognosis, GOC, EOL wishes, disposition, and options. She is a Charity fundraiser at [employer] and is working today.   I introduced Palliative Medicine as specialized medical care for people living with serious illness. It focuses on providing relief from the symptoms and stress of a serious illness. The goal is to improve quality of life for both the patient and the family.   We discussed a brief life review of the patient as well as functional and nutritional status. Patient's husband passed away about three weeks ago. Penni Bombard shares with me that the patient had expressed multiple times she wanted to "go be with" him. Therapeutic listening and emotional support provided as Penni Bombard is understandably tearful discussing the loss of her father  and now her mother's illness/terminal care. Prior to hospitalization, patient was living with Jeannine.    We discussed patient's current illness and what it means in the larger context of patient's on-going co-morbidities.  Natural disease trajectory and expectations at EOL were discussed. I attempted to elicit values and goals of care important to the patient. The difference between aggressive medical intervention and comfort care was considered in light of the patient's goals of care. Jeannine states "I don't want her to suffer." She expresses her desire for full comfort measures for the patient. We talked  about transition to comfort measures in house and what that would entail inclusive of medications to control pain, dyspnea, agitation, nausea, itching, and hiccups. We discussed stopping all unnecessary measures such as blood draws, needle sticks, oxygen, antibiotics, CBGs/insulin, cardiac monitoring, and frequent vital signs as well as remdesivire. Jeannine was agreeable to full comfort measures.   Jeannine noted she had been told/previously discussed transfer to [facility]. Reviewed that [facility] unfortunately does not accept COVID+ patients until off isolation. We discussed transfer to a [place], who does accept COVID patients - she was agreeable.   Visit also consisted of discussions dealing with the complex and emotionally intense issues of symptom management and palliative care in the setting of serious and life-threatening illness. Palliative care team will continue to support patient, patient's family, and medical team.   Discussed with family the importance of continued conversation with the medical providers regarding overall plan of care and treatment options, ensuring decisions are within the context of the patient's values and GOCs.     Questions and concerns were addressed. The patient/family was encouraged to call with questions and/or concerns. PMT card was provided.    Primary Decision Maker: Daughter/Jeannine  SUMMARY OF RECOMMENDATIONS   Continue full comfort measures Continue DNR/DNI as previously documented - durable DNR form completed and original left with ED RN. Copy made and will be scanned into Vynca/ACP tab [facility] does not accept patients COVID+ until off isolation. Patient's daughter was agreeable to [facility] - they do accept COVID patients. TOC consult placed. Added orders for EOL symptom management and to reflect full comfort measures, as well as discontinued orders that were not focused on comfort Unrestricted visitation orders were  placed per current [place] EOL visitation policy  Nursing to provide frequent assessments and administer PRN medications as clinically necessary to ensure EOL comfort PMT will continue to follow holistically  Code Status/Advance Care Planning: DNR   Palliative Prophylaxis:  Aspiration, Bowel Regimen, Delirium Protocol, Frequent Pain Assessment, Oral Care, and Turn Reposition  Additional Recommendations (Limitations, Scope, Preferences): Full Comfort Care  Psycho-social/Spiritual:  Desire for further Chaplaincy support:no Created space and opportunity for patient and family to express thoughts and feelings regarding patient's current medical situation.  Emotional support and therapeutic listening provided.   Prognosis:  < 2 weeks  Discharge Planning: Hospice facility      Primary Diagnoses: Present on Admission:  COVID-19 virus infection   I have reviewed the medical record, interviewed the patient and family, and examined the patient. The following aspects are pertinent.  Past Medical History:  Diagnosis Date   CAD (coronary artery disease)    DM (diabetes mellitus) ([place])    HTN (hypertension)    Hypercholesterolemia    Hypothyroidism    Low back pain    Social History   Socioeconomic History   Marital status: Married    Spouse name: Not on file  Number of children: Not on file   Years of education: Not on file   Highest education level: Not on file  Occupational History   Not on file  Tobacco Use   Smoking status: Former    Types: Cigarettes   Smokeless tobacco: Never   Tobacco comments:    quit Jan 2001  Substance and Sexual Activity   Alcohol use: Never   Drug use: Not on file   Sexual activity: Not on file  Other Topics Concern   Not on file  Social History Narrative   Not on file   Social Determinants of Health   Financial Resource Strain: Not on file  Food Insecurity: Not on file  Transportation Needs: Not on file  Physical  Activity: Not on file  Stress: Not on file  Social Connections: Not on file   Family History  Problem Relation Age of Onset   Diabetes Mother    Heart failure Mother    Scheduled Meds:  antiseptic oral rinse  15 mL Mouth Rinse BID   dorzolamide-timolol  1 drop Both Eyes BID   Continuous Infusions: PRN Meds:.acetaminophen **OR** acetaminophen, albuterol, glycopyrrolate, guaiFENesin-dextromethorphan, haloperidol lactate, LORazepam, morphine injection, ondansetron **OR** ondansetron (ZOFRAN) IV Medications Prior to Admission:  Prior to Admission medications   Medication Sig Start Date End Date Taking? Authorizing Provider  CALCIUM PO Take 1 tablet by mouth daily.   Yes [provider]  ciprofloxacin (CIPRO) 500 MG tablet Take 500 mg by mouth 2 (two) times daily. 7 day supply 09/23/20  Yes [provider]  dorzolamide-timolol (COSOPT) 22.3-6.8 MG/ML ophthalmic solution 1 drop 2 (two) times daily.   Yes [provider]  famotidine (PEPCID) 40 MG tablet Take 40 mg by mouth daily. 08/20/20  Yes [provider]  levothyroxine (SYNTHROID) 88 MCG tablet Take 88-176 mcg by mouth daily before breakfast. Monday Wednesday Friday 158mg All other days 843m 08/20/20  Yes [provider]  metFORMIN (GLUCOPHAGE) 500 MG tablet Take 500 mg by mouth daily. 08/20/20  Yes [provider]  metoprolol succinate (TOPROL-XL) 100 MG 24 hr tablet Take 100 mg by mouth daily. 08/20/20  Yes [provider]  simvastatin (ZOCOR) 40 MG tablet Take 40 mg by mouth every evening.   Yes [provider]  spironolactone (ALDACTONE) 50 MG tablet Take 50 mg by mouth daily.   Yes [provider]  aspirin 81 MG tablet Take 81 mg by mouth daily. Patient not taking: Reported on 09/28/2020    [provider]  atenolol (TENORMIN) 25 MG tablet TAKE 1 TABLET BY MOUTH TWICE A DAY Patient not taking: Reported on 09/28/2020 05/26/10   Nahser, PhWonda ChengMD   levothyroxine (SYNTHROID) 75 MCG tablet Take 1 tab on one day then 2 tabs 6 days of the week Patient not taking: No sig reported    [provider]  metFORMIN (GLUCOPHAGE-XR) 500 MG 24 hr tablet Take 500 mg by mouth daily with breakfast. Patient not taking: No sig reported    [provider]   Allergies  Allergen Reactions   Copper-Containing Compounds Swelling    Other reaction(s): Other Earrings cause redness and drainage   Gabapentin     Other reaction(s): Other Severe vomiting   Nickel Rash   Codeine     Other reaction(s): Other (See Comments) Unsure of reaction    Ramipril     cough   Review of Systems  Unable to perform ROS: Dementia   Physical Exam Vitals and nursing  note reviewed.  Constitutional:      General: She is not in acute distress.    Appearance: She is ill-appearing.  Pulmonary:     Effort: No respiratory distress.  Skin:    General: Skin is warm and dry.  Neurological:     Mental Status: She is alert. She is disoriented and confused.     Motor: Weakness present.  Psychiatric:        Mood and Affect: Mood is anxious.        Behavior: Behavior is agitated.        Cognition and Memory: Cognition is impaired. Memory is impaired.    Vital Signs: BP (!) 89/50 (BP Location: Right Arm)   Pulse (!) 119   Temp 98.9 F (37.2 C) (Oral)   Resp 17   SpO2 92%  Pain Scale: PAINAD   Pain Score: 0-No pain   SpO2: SpO2: 92 % O2 Device:SpO2: 92 % O2 Flow Rate: .O2 Flow Rate (L/min): 2 L/min  IO: Intake/output summary:  Intake/Output Summary (Last 24 hours) at 09/29/2020 1104 Last data filed at 09/28/2020 2212 Gross per 24 hour  Intake 226.91 ml  Output 150 ml  Net 76.91 ml    LBM: Last BM Date: 09/28/20 Baseline Weight:   Most recent weight:       Palliative Assessment/Data: PPS 10-20%     Time In: 1430 Time Out: 1545 Time Total: 75 minutes  Greater than 50%  of this time was spent counseling and coordinating care related to  the above assessment and plan.  Signed by: Lin Landsman, NP   Please contact Palliative Medicine Team phone at [phone] for questions and concerns.  For individual provider: See Shea [place]

## 2020-09-29 NOTE — Significant Event (Addendum)
Patient became unstable this afternoon  for transfer to residential hospice and started having agonal breathing.Palliative care discussed with family and  wants to continue comfort care here until end of life.

## 2020-09-29 NOTE — Progress Notes (Signed)
Daily Progress Note   Patient Name: Kathy Turner       Date: 09/29/2020 DOB: [birth year]  Age: 87 y.o. MRN#: [id number] Attending Physician: Shelly Coss, MD Primary Care Physician: Jettie Booze, NP Admit Date: 09/07/2020  Reason for Consultation/Follow-up: Disposition, Establishing goals of care, Non pain symptom management, Pain control, Psychosocial/spiritual support, and Terminal Care  Subjective: Received notification that transport had arrived to pick up patient for [facility]; however, family were questioning whether she was stable enough for transfer.  Went to visit patient at bedside - patient was lying in bed asleep, she does not wake to voice or gentle touch. Periods of apneic breathing were noted. No signs or non-verbal gestures of pain or discomfort noted. No respiratory distress or secretions - increased work of breathing noted. Patient is actively dying.  Discussed findings with patient's daughter/Jeannine and granddaughter. Explained that low risk transfer window was likely closing quickly/may be closing now - if their goal was to get to [place] recommend transfer now. Discussed option of patient remaining in house for EOL continuing comfort care if family did not want to risk patient passing in transport. Allowed time for family to discuss options - they opted for patient to remain in house and would like to cancel hospice transfer.   Discussed with family and primary RN symptom management plan.   Chart review performed. Received report from primary RN - no acute concerns outside increased work of breathing.  6:00 PM Chart review performed. Noted patient was requiring frequent EOL PRN medications. Had discussed patient's symptoms (increased work of breathing)  throughout the day with primary RN - patient's symptoms were unmanaged despite scheduling, giving combination of, and increasing comfort medication doses throughout the day. Went back to bedside to discuss starting morphine drip with patient's daughter - all questions answered - daughter was agreeable. Therapeutic listening provided to daughter as she describes how hard it will be to lose both her parents in the same month - emotional support provided. Declined chaplain visit. Medication orders adjusted.  Length of Stay: 2  Current Medications: Scheduled Meds:   antiseptic oral rinse  15 mL Mouth Rinse BID   dorzolamide-timolol  1 drop Both Eyes BID    Continuous Infusions:   PRN Meds: acetaminophen **OR** acetaminophen, albuterol, glycopyrrolate, guaiFENesin-dextromethorphan, haloperidol lactate, LORazepam,  morphine injection, ondansetron **OR** ondansetron (ZOFRAN) IV  Physical Exam Vitals and nursing note reviewed.  Constitutional:      General: She is not in acute distress.    Appearance: She is cachectic. She is ill-appearing.  Pulmonary:     Effort: No respiratory distress.     Comments: Increased work of breathing Skin:    General: Skin is cool and dry.  Neurological:     Mental Status: She is unresponsive.     Motor: Weakness present.  Psychiatric:        Speech: She is noncommunicative.            Vital Signs: BP (!) 89/50 (BP Location: Right Arm)   Pulse (!) 119   Temp 98.9 F (37.2 C) (Oral)   Resp 17   SpO2 92%  SpO2: SpO2: 92 % O2 Device: O2 Device: Room Air O2 Flow Rate: O2 Flow Rate (L/min): 2 L/min  Intake/output summary:  Intake/Output Summary (Last 24 hours) at 09/29/2020 1407 Last data filed at 09/28/2020 2212 Gross per 24 hour  Intake 226.91 ml  Output 150 ml  Net 76.91 ml   LBM: Last BM Date: 09/28/20 Baseline Weight:   Most recent weight:         Palliative Assessment/Data: PPS 10%    Flowsheet Rows    Flowsheet Row Most Recent Value   Intake Tab   Referral Department Hospitalist  Unit at Time of Referral ER  Palliative Care Primary Diagnosis Sepsis/Infectious Disease  Date Notified 09/28/20  Palliative Care Type New Palliative care  Reason for referral End of Life Care Assistance  Date of Admission 09/05/2020  Date first seen by Palliative Care 09/28/20  # of days Palliative referral response time 0 Day(s)  # of days IP prior to Palliative referral 1  Clinical Assessment   Psychosocial & Spiritual Assessment   Palliative Care Outcomes   Patient/Family meeting held? Yes  Who was at the meeting? daughter  Palliative Care Outcomes Improved pain interventions, Improved non-pain symptom therapy, Clarified goals of care, Counseled regarding hospice, Provided end of life care assistance, Provided psychosocial or spiritual support, Completed durable DNR, Transitioned to hospice  Patient/Family wishes: Interventions discontinued/not started  Mechanical Ventilation, Antibiotics, BiPAP, Tube feedings/TPN, NIPPV, Hemodialysis, Transfusion, Trach, PEG, Vasopressors, Transfer out of ICU       Patient Active Problem List   Diagnosis Date Noted   COVID-19 virus infection 09/24/2020   Hypotension 08/30/2020   Expressive aphasia 09/05/2020   Diabetes mellitus type 2 in obese ([place]) 09/18/2020   AF (paroxysmal atrial fibrillation) ([place]) 09/14/2020   Generalized weakness 09/25/2020   MCI (mild cognitive impairment) 09/10/2020   History of lung cancer 09/21/2020   Dementia ([place]) 09/01/2020   AKI (acute kidney injury) ([place]) 09/23/2020   Hypokalemia 09/19/2020    Palliative Care Assessment & Plan   Patient Profile: 87 y.o. female  with past medical history of coronary artery disease, dementia, hypertension, DM type 2, hypothyroidism, lung cancer/breast cancer presented to the ED on 09/21/2020 from home with complaints of syncopal events x3. Patient was admitted on 09/07/2020 with hypotension, COVID infection, expressive aphasia,  generalized weakness, AKI. After conversations with daughter, she ultimately decided to pursue comfort only measures.   ED Course: In the emergency room patient was first noted to be in atrial fibrillation which spontaneously resolved.  Patient was noted to have a low blood pressure of 70-80/45-55 which improved with IVF hydration.  Code stroke was not initiated as patient had been in  over 24 hours since symptoms began.  CT of the head showed no acute intracranial pathology.  He was given dose of Rocephin and azithromycin with mild diffuse social prominence in the lungs on chest x-ray.  Patient was found to be COVID-19 positive on testing.  Labs revealed a troponin of 27 and a BNP of 118.  Lactic acid 0.9.  WBCs 2800, hemoglobin 10.3, hematocrit 32.6, platelets 141,000.  Sodium 137, potassium 3.3, chloride 109, bicarb 22, creatinine 1.49, BUN 15, alkaline phosphatase 76, AST 16, ALT 7, glucose 111.  Patient was given remdesivir in the emergency room.  She has been maintaining O2 sats in the 96 to 98% range on room air and has not required any supplemental oxygen.  Hospitalist service been asked to admit for further management  Assessment: COVID-19 infection Hypotension Expressive aphasia Atrial fibrillation Generalized weakness Dementia AKI Terminal care  Recommendations/Plan: Continue full comfort care Continue DNR/DNI as previously documented Patient's window for low risk transfer to hospice facility has likely closed Cancel transfer to [place] - family has opted to keep patient in house for EOL Morphine drip 2mg /hr along with 1-3mg  bolus doses. Increase basal rate as needed per order PMT will continue to follow and support holistically  Goals of Care and Additional Recommendations: Limitations on Scope of Treatment: Full Comfort Care, Initiate Comfort Feeding, No Artificial Feeding, No Surgical Procedures, and No Tracheostomy  Code Status:    Code Status Orders  (From  admission, onward)           Start     Ordered   09/28/20 1346  Do not attempt resuscitation (DNR)  Continuous       Question Answer Comment  In the event of cardiac or respiratory ARREST Do not call a "code blue"   In the event of cardiac or respiratory ARREST Do not perform Intubation, CPR, defibrillation or ACLS   In the event of cardiac or respiratory ARREST Use medication by any route, position, wound care, and other measures to relive pain and suffering. May use oxygen, suction and manual treatment of airway obstruction as needed for comfort.      09/28/20 1345           Code Status History     Date Active Date Inactive Code Status Order ID Comments User Context   09/28/2020 0233 09/28/2020 1345 Full Code [id number]  Chotiner, Yevonne Aline, MD ED       Prognosis:  Hours - Days  Discharge Planning: Anticipated Hospital Death  Care plan was discussed with primary RN, Dr. Tawanna Solo, patient's family, TOC  Thank you for allowing the Palliative Medicine Team to assist in the care of this patient.   Total Time 50 minutes Prolonged Time Billed  no      Greater than 50%  of this time was spent counseling and coordinating care related to the above assessment and plan.  Lin Landsman, NP  Please contact Palliative Medicine Team phone at [phone] for questions and concerns.

## 2020-09-29 NOTE — Progress Notes (Signed)
Palliative NP, daughter and daughter in law at bedside

## 2020-09-29 NOTE — TOC Transition Note (Signed)
Transition of Care [facility]) - CM/SW Discharge Note   Patient Details  Name: Kathy Turner MRN: [id number] Date of Birth: [birth year]  Transition of Care [facility]) CM/SW Contact:  Benard Halsted, LCSW Phone Number: 09/29/2020, 11:41 AM   Clinical Narrative:    Patient will DC to: [facility] hospice Anticipated DC date: 09/29/20 Family notified: Daughter Transport by: Corey Harold   Per MD patient ready for DC to Hospice. RN to call report prior to discharge [phone]). RN, patient, patient's family, and facility notified of DC. Discharge Summary sent to facility. DC packet on chart with DNR. Ambulance transport requested for patient.   CSW will sign off for now as social work intervention is no longer needed. Please consult [place] again if new needs arise.     Final next level of care: [place] Barriers to Discharge: No Barriers Identified   Patient Goals and CMS Choice Patient states their goals for this hospitalization and ongoing recovery are:: Comfort CMS Medicare.gov Compare Post Acute Care list provided to:: Patient Represenative (must comment) Choice offered to / list presented to : Adult Children  Discharge Placement              Patient chooses bed at: Other - please specify in the comment section below: [facility]) Patient to be transferred to facility by: [place] Name of family member notified: Daughter Patient and family notified of of transfer: 09/29/20  Discharge Plan and Services In-house Referral: Clinical Social Work, Hospice / Palliative Care   Post Acute Care Choice: Hospice                               Social Determinants of Health (SDOH) Interventions     Readmission Risk Interventions No flowsheet data found.

## 2020-09-29 NOTE — Progress Notes (Signed)
Report called to michelle at hospice

## 2020-09-29 NOTE — TOC Transition Note (Signed)
Transition of Care [facility]) - CM/SW Discharge Note   Patient Details  Name: WEDA BAUMGARNER MRN: [id number] Date of Birth: 09/17/1932  Transition of Care [facility]) CM/SW Contact:  Benard Halsted, LCSW Phone Number: 09/29/2020, 2:27 PM   Clinical Narrative:    CSW made aware that family prefers for patient to remain in the hospital. [place] notified Hospice.    Final next level of care: [place] Barriers to Discharge: No Barriers Identified   Patient Goals and CMS Choice Patient states their goals for this hospitalization and ongoing recovery are:: Comfort CMS Medicare.gov Compare Post Acute Care list provided to:: Patient Represenative (must comment) Choice offered to / list presented to : Adult Children  Discharge Placement              Patient chooses bed at: Other - please specify in the comment section below: [facility]) Patient to be transferred to facility by: [place] Name of family member notified: Daughter Patient and family notified of of transfer: 09/29/20  Discharge Plan and Services In-house Referral: Clinical Social Work, Hospice / Palliative Care   Post Acute Care Choice: Hospice                               Social Determinants of Health (SDOH) Interventions     Readmission Risk Interventions No flowsheet data found.

## 2020-09-29 NOTE — Discharge Summary (Signed)
Physician Discharge Summary  Kathy Turner SHF:[id number] DOB: 03/18/1932 DOA: 09/11/2020  PCP: Jettie Booze, NP  Admit date: 09/21/2020 Discharge date: 09/29/2020  Admitted From: Home Disposition:  Home  Discharge Condition:Stable CODE STATUS:Comfort Care Diet recommendation: Regular    Brief/Interim Summary:  Patient is a 87 year old female with history of coronary artery disease, dementia,hypertension, diabetes type 2, hypothyroidism, lung cancer/breast cancer  followed by oncology, dementia who presented from home for the evaluation of generalized weakness, low blood pressure.  She was found to be very weak at home, unable to ambulate.  She was also having difficulty finding words and expressing herself.  When her blood pressure was checked at home it was 80/60 so she was brought to the emergency department.  She was having poor oral intake at home.  When EMS arrived, she was severely hypotensive, EKG monitor showed A. fib with RVR, given 10 mg of diltiazem after which her heart rate improved.  On presentation, COVID screen test was positive.  EKG rhythm on presentation to ED was sinus.  She was found to be hypotensive.  CTA did not show any acute intracranial abnormalities.  Chest x-ray showed mild diffuse interstitial prominence in the lungs.  BNP was 119, troponin was 97.  Patient was started on remdesivir.  She was saturating fine on room air on arrival. Patient was admitted for the management of generalized weakness, COVID.  Palliative care consulted for goals of care discussion due to her multiple comorbidities, history of cancer, dementia. After long discussion with the daughter, we transitioned her care to comfort.  Patient is being planned to be transferred to [facility] today.  Discharge Diagnoses:  Principal Problem:   COVID-19 virus infection Active Problems:   Hypotension   Expressive aphasia   Diabetes mellitus type 2 in obese (HCC)   AF (paroxysmal atrial  fibrillation) (HCC)   Generalized weakness   History of lung cancer   Dementia (HCC)   AKI (acute kidney injury) ([place])   Hypokalemia    Discharge Instructions   Allergies as of 09/29/2020       Reactions   Copper-containing Compounds Swelling   Other reaction(s): Other Earrings cause redness and drainage   Gabapentin    Other reaction(s): Other Severe vomiting   Nickel Rash   Codeine    Other reaction(s): Other (See Comments) Unsure of reaction    Ramipril    cough        Medication List     STOP taking these medications    aspirin 81 MG tablet   atenolol 25 MG tablet Commonly known as: TENORMIN   CALCIUM PO   ciprofloxacin 500 MG tablet Commonly known as: CIPRO   dorzolamide-timolol 22.3-6.8 MG/ML ophthalmic solution Commonly known as: COSOPT   famotidine 40 MG tablet Commonly known as: PEPCID   levothyroxine 75 MCG tablet Commonly known as: SYNTHROID   levothyroxine 88 MCG tablet Commonly known as: SYNTHROID   metFORMIN 500 MG 24 hr tablet Commonly known as: GLUCOPHAGE-XR   metFORMIN 500 MG tablet Commonly known as: GLUCOPHAGE   metoprolol succinate 100 MG 24 hr tablet Commonly known as: TOPROL-XL   simvastatin 40 MG tablet Commonly known as: ZOCOR   spironolactone 50 MG tablet Commonly known as: ALDACTONE        Allergies  Allergen Reactions   Copper-Containing Compounds Swelling    Other reaction(s): Other Earrings cause redness and drainage   Gabapentin     Other reaction(s): Other Severe vomiting   Nickel Rash  Codeine     Other reaction(s): Other (See Comments) Unsure of reaction    Ramipril     cough    Consultations: Palliative care   Procedures/Studies: CT Head Wo Contrast  Result Date: 09/26/2020 CLINICAL DATA:  Neuro deficit, acute, stroke suspected Syncopal episodes. EXAM: CT HEAD WITHOUT CONTRAST TECHNIQUE: Contiguous axial images were obtained from the base of the skull through the vertex without  intravenous contrast. COMPARISON:  None. FINDINGS: Brain: No hemorrhage. No evidence of acute ischemia. Moderate generalized atrophy. Periventricular white matter hypodensity typical of chronic small vessel ischemia, moderate for age. 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 Scattered calcifications in the right cerebral hemisphere suggesting prior infection or inflammatory process. No subdural or extra-axial collection. No midline shift or mass lesion/mass effect. Basilar cisterns are patent. Partially empty sella. Vascular: No hyperdense vessel.  Mild basilar Dolichoectasia. Skull: No fracture or focal lesion. Sinuses/Orbits: Defect of the right lamina papyracea no herniation of fat appears chronic, there is no adjacent inflammation. Bilateral cataract resection. No mastoid effusion. Other: None. IMPRESSION: 1. No acute intracranial abnormality. 2. Generalized atrophy and chronic small vessel ischemia. 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 3. Scattered calcifications in the right cerebral hemisphere suggesting prior infection or inflammatory process. Electronically Signed   By: Keith Rake M.D.   On: 09/13/2020 17:46   MR BRAIN WO CONTRAST  Result Date: 09/28/2020 CLINICAL DATA:  Neuro deficit, acute, stroke suspected. EXAM: MRI HEAD WITHOUT CONTRAST TECHNIQUE: Multiplanar, multiecho pulse sequences of the brain and surrounding structures were obtained without intravenous contrast. COMPARISON:  Head CT yesterday FINDINGS: Brain: Diffusion imaging does not show any acute or subacute infarction. Minor T2 shine through in the left parietal white matter does not represent a true recent infarction. Chronic small-vessel ischemic changes affect the pons. No focal cerebellar finding. Cerebral hemispheres show chronic small-vessel changes of the thalami, basal ganglia and cerebral hemispheric white matter. Old small right temporal infarction. No mass, hemorrhage, hydrocephalus  or extra-axial collection. Vascular: Pronounced dolichoectasia of the vertebrobasilar system. Flow present in both carotid systems. Skull and upper cervical spine: Negative Sinuses/Orbits: Mild mucosal disease of the right ethmoid region. Orbits negative. Other: None IMPRESSION: No acute MRI finding. Age related atrophy. Chronic small-vessel ischemic changes throughout the brain as outlined above. Old small right temporal infarction. Electronically Signed   By: Nelson Chimes M.D.   On: 09/28/2020 11:06   DG Chest Port 1 View  Result Date: 09/22/2020 CLINICAL DATA:  Syncope, shortness of breath EXAM: PORTABLE CHEST 1 VIEW COMPARISON:  None. FINDINGS: Right Port-A-Cath is in place with the tip at the cavoatrial junction. Heart is borderline in size. Diffuse interstitial prominence and perihilar/lower lobe opacities. No effusions. No acute bony abnormality. IMPRESSION: Diffuse interstitial prominence and perihilar opacities could reflect edema or infection. Electronically Signed   By: Rolm Baptise M.D.   On: 09/19/2020 15:18   DG Abd Portable 1V  Result Date: 09/28/2020 CLINICAL DATA:  MRI clearance, loss of consciousness, COVID-19 positive EXAM: PORTABLE ABDOMEN - 1 VIEW COMPARISON:  Portable exam 0928 hours without priors for comparison FINDINGS: Multiple EKG leads project over lower chest and abdomen. No additional metallic foreign bodies identified. Nonobstructive bowel gas pattern. Osseous demineralization with degenerative changes lumbar spine. IMPRESSION: Multiple EKG leads. No additional metallic foreign bodies identified. Electronically Signed   By: Lavonia Dana M.D.   On: 09/28/2020 09:58         Discharge Exam: Vitals:   09/29/20 0000  09/29/20 0738  BP: 103/89 (!) 89/50  Pulse: (!) 155 (!) 119  Resp:    Temp:    SpO2: 92% 92%   Vitals:   09/28/20 1610 09/28/20 1946 09/29/20 0000 09/29/20 0738  BP: (!) 123/58 107/70 103/89 (!) 89/50  Pulse:  (!) 152 (!) 155 (!) 119  Resp: 15 17     Temp:  98.9 F (37.2 C)    TempSrc:  Oral    SpO2:  93% 92% 92%      The results of significant diagnostics from this hospitalization (including imaging, microbiology, ancillary and laboratory) are listed below for reference.     Microbiology: Recent Results (from the past 240 hour(s))  Culture, blood (routine x 2)     Status: None (Preliminary result)   Collection Time: 09/15/2020  3:18 PM   Specimen: BLOOD  Result Value Ref Range Status   Specimen Description BLOOD SITE NOT SPECIFIED  Final   Special Requests   Final    BOTTLES DRAWN AEROBIC AND ANAEROBIC Blood Culture adequate volume   Culture   Final    NO GROWTH 2 DAYS Performed at [facility], [address], [place], [place] [address]    Report Status PENDING  Incomplete  Culture, blood (routine x 2)     Status: None (Preliminary result)   Collection Time: 09/22/2020  3:23 PM   Specimen: BLOOD  Result Value Ref Range Status   Specimen Description BLOOD SITE NOT SPECIFIED  Final   Special Requests   Final    BOTTLES DRAWN AEROBIC AND ANAEROBIC Blood Culture results may not be optimal due to an excessive volume of blood received in culture bottles   Culture   Final    NO GROWTH 2 DAYS Performed at [facility], [place] [address] [place], [place] [address]    Report Status PENDING  Incomplete  Resp Panel by RT-PCR (Flu A&B, Covid) Nasopharyngeal Swab     Status: Abnormal   Collection Time: 09/15/2020  3:56 PM   Specimen: Nasopharyngeal Swab; Nasopharyngeal(NP) swabs in vial transport medium  Result Value Ref Range Status   SARS Coronavirus 2 by RT PCR POSITIVE (A) NEGATIVE Final    Comment: RESULT CALLED TO, READ BACK BY AND VERIFIED WITH: RN C.COBB ON [id number] AT 1851 BY E.PARRISH (NOTE) SARS-CoV-2 target nucleic acids are DETECTED.  The SARS-CoV-2 RNA is generally detectable in upper respiratory specimens during the acute phase of infection. Positive results are indicative of the presence of the  identified virus, but do not rule out bacterial infection or co-infection with other pathogens not detected by the test. Clinical correlation with patient history and other diagnostic information is necessary to determine patient infection status. The expected result is Negative.  Fact Sheet for Patients: EntrepreneurPulse.com.au  Fact Sheet for Healthcare Providers: IncredibleEmployment.be  This test is not yet approved or cleared by the [place] FDA and  has been authorized for detection and/or diagnosis of SARS-CoV-2 by FDA under an Emergency Use Authorization (EUA).  This EUA will remain in effect (meaning this te st can be used) for the duration of  the COVID-19 declaration under Section 564(b)(1) of the Act, 21 U.S.C. section 360bbb-3(b)(1), unless the authorization is terminated or revoked sooner.     Influenza A by PCR NEGATIVE NEGATIVE Final   Influenza B by PCR NEGATIVE NEGATIVE Final    Comment: (NOTE) The Xpert Xpress SARS-CoV-2/FLU/RSV plus assay is intended as an aid in the diagnosis of influenza from Nasopharyngeal  swab specimens and should not be used as a sole basis for treatment. Nasal washings and aspirates are unacceptable for Xpert Xpress SARS-CoV-2/FLU/RSV testing.  Fact Sheet for Patients: EntrepreneurPulse.com.au  Fact Sheet for Healthcare Providers: IncredibleEmployment.be  This test is not yet approved or cleared by the [place] FDA and has been authorized for detection and/or diagnosis of SARS-CoV-2 by FDA under an Emergency Use Authorization (EUA). This EUA will remain in effect (meaning this test can be used) for the duration of the COVID-19 declaration under Section 564(b)(1) of the Act, 21 U.S.C. section 360bbb-3(b)(1), unless the authorization is terminated or revoked.  Performed at [facility], [place] [address], [place], [place] [address]       Labs: BNP (last 3 results) Recent Labs    09/04/2020 1506  BNP 025.8*   Basic Metabolic Panel: Recent Labs  Lab 09/05/2020 1506 09/23/2020 2020 09/28/20 0240  NA 137  --  140  K 3.3*  --  3.5  CL 109  --  108  CO2 22  --  25  GLUCOSE 111*  --  95  BUN 15  --  11  CREATININE 1.49*  --  1.10*  CALCIUM 7.4*  --  8.0*  MG  --  1.4*  --    Liver Function Tests: Recent Labs  Lab 09/26/2020 1506 09/28/20 0240  AST 16 17  ALT 7 8  ALKPHOS 76 80  BILITOT 0.3 0.2*  PROT 4.7* 5.3*  ALBUMIN 2.5* 2.7*   No results for input(s): LIPASE, AMYLASE in the last 168 hours. No results for input(s): AMMONIA in the last 168 hours. CBC: Recent Labs  Lab 09/22/2020 1506 09/28/20 0433  WBC 2.8* 2.8*  NEUTROABS 1.8  --   HGB 10.3* 11.4*  HCT 32.6* 35.9*  MCV 99.1 98.1  PLT 141* 133*   Cardiac Enzymes: No results for input(s): CKTOTAL, CKMB, CKMBINDEX, TROPONINI in the last 168 hours. BNP: Invalid input(s): POCBNP CBG: Recent Labs  Lab 09/28/20 0755  GLUCAP 92   D-Dimer Recent Labs    09/26/2020 2020 09/28/20 0433  DDIMER 2.05* 2.47*   Hgb A1c No results for input(s): HGBA1C in the last 72 hours. Lipid Profile Recent Labs    09/14/2020 2020  TRIG 119   Thyroid function studies Recent Labs    09/28/20 0233  TSH 3.439   Anemia work up Recent Labs    09/18/2020 2020 09/28/20 0240  FERRITIN 578* 749*   Urinalysis    Component Value Date/Time   COLORURINE YELLOW 08/28/2020 1640   APPEARANCEUR CLOUDY (A) 09/24/2020 1640   LABSPEC 1.017 08/31/2020 1640   PHURINE 5.0 09/26/2020 1640   GLUCOSEU NEGATIVE 09/19/2020 1640   HGBUR NEGATIVE 09/18/2020 1640   BILIRUBINUR NEGATIVE 09/20/2020 1640   KETONESUR NEGATIVE 09/03/2020 1640   PROTEINUR NEGATIVE 09/06/2020 1640   NITRITE NEGATIVE 09/08/2020 1640   LEUKOCYTESUR SMALL (A) 09/24/2020 1640   Sepsis Labs Invalid input(s): PROCALCITONIN,  WBC,  LACTICIDVEN Microbiology Recent Results (from the past 240 hour(s))   Culture, blood (routine x 2)     Status: None (Preliminary result)   Collection Time: 09/21/2020  3:18 PM   Specimen: BLOOD  Result Value Ref Range Status   Specimen Description BLOOD SITE NOT SPECIFIED  Final   Special Requests   Final    BOTTLES DRAWN AEROBIC AND ANAEROBIC Blood Culture adequate volume   Culture   Final    NO GROWTH 2 DAYS Performed at [facility], [address], [place], [place] [address]    Report Status PENDING  Incomplete  Culture, blood (routine x 2)     Status: None (Preliminary result)   Collection Time: 09/14/2020  3:23 PM   Specimen: BLOOD  Result Value Ref Range Status   Specimen Description BLOOD SITE NOT SPECIFIED  Final   Special Requests   Final    BOTTLES DRAWN AEROBIC AND ANAEROBIC Blood Culture results may not be optimal due to an excessive volume of blood received in culture bottles   Culture   Final    NO GROWTH 2 DAYS Performed at [facility], [place] [address], [place], [place] [address]    Report Status PENDING  Incomplete  Resp Panel by RT-PCR (Flu A&B, Covid) Nasopharyngeal Swab     Status: Abnormal   Collection Time: 09/05/2020  3:56 PM   Specimen: Nasopharyngeal Swab; Nasopharyngeal(NP) swabs in vial transport medium  Result Value Ref Range Status   SARS Coronavirus 2 by RT PCR POSITIVE (A) NEGATIVE Final    Comment: RESULT CALLED TO, READ BACK BY AND VERIFIED WITH: RN C.COBB ON [id number] AT 1851 BY E.PARRISH (NOTE) SARS-CoV-2 target nucleic acids are DETECTED.  The SARS-CoV-2 RNA is generally detectable in upper respiratory specimens during the acute phase of infection. Positive results are indicative of the presence of the identified virus, but do not rule out bacterial infection or co-infection with other pathogens not detected by the test. Clinical correlation with patient history and other diagnostic information is necessary to determine patient infection status. The expected result is Negative.  Fact Sheet for  Patients: EntrepreneurPulse.com.au  Fact Sheet for Healthcare Providers: IncredibleEmployment.be  This test is not yet approved or cleared by the [place] FDA and  has been authorized for detection and/or diagnosis of SARS-CoV-2 by FDA under an Emergency Use Authorization (EUA).  This EUA will remain in effect (meaning this te st can be used) for the duration of  the COVID-19 declaration under Section 564(b)(1) of the Act, 21 U.S.C. section 360bbb-3(b)(1), unless the authorization is terminated or revoked sooner.     Influenza A by PCR NEGATIVE NEGATIVE Final   Influenza B by PCR NEGATIVE NEGATIVE Final    Comment: (NOTE) The Xpert Xpress SARS-CoV-2/FLU/RSV plus assay is intended as an aid in the diagnosis of influenza from Nasopharyngeal swab specimens and should not be used as a sole basis for treatment. Nasal washings and aspirates are unacceptable for Xpert Xpress SARS-CoV-2/FLU/RSV testing.  Fact Sheet for Patients: EntrepreneurPulse.com.au  Fact Sheet for Healthcare Providers: IncredibleEmployment.be  This test is not yet approved or cleared by the [place] FDA and has been authorized for detection and/or diagnosis of SARS-CoV-2 by FDA under an Emergency Use Authorization (EUA). This EUA will remain in effect (meaning this test can be used) for the duration of the COVID-19 declaration under Section 564(b)(1) of the Act, 21 U.S.C. section 360bbb-3(b)(1), unless the authorization is terminated or revoked.  Performed at [facility], [place] [address], [place],  [address]     Please note: You were cared for by a hospitalist during your hospital stay. Once you are discharged, your primary care physician will handle any further medical issues. Please note that NO REFILLS for any discharge medications will be authorized once you are discharged, as it is imperative that you return to  your primary care physician (or establish a relationship with a primary care physician if you do not have one) for your post hospital discharge needs so  that they can reassess your need for medications and monitor your lab values.    Time coordinating discharge: 40 minutes  SIGNED:   Shelly Coss, MD  [facility] 09/29/2020, 10:18 AM Pager [phone]  If 7PM-7AM, please contact night-coverage [email or web address] Password [email or web address]

## 2020-09-30 DIAGNOSIS — U071 COVID-19: Secondary | ICD-10-CM | POA: Diagnosis not present

## 2020-10-02 LAB — CULTURE, BLOOD (ROUTINE X 2)
Culture: NO GROWTH
Culture: NO GROWTH
Special Requests: ADEQUATE

## 2020-10-29 NOTE — Death Summary Note (Signed)
[facility] Death Note                                                                                                                                                                                               Kathy Turner, is a 87 y.o. female, DOB - Feb 16, 1933, RWE:[id number]  Admit date - 09/17/2020   Admitting Physician Eben Burow, MD  Outpatient Primary MD for the patient is Jettie Booze, NP  LOS - 3  Chief Complaint  Patient presents with   Loss of Consciousness    X3        Notification: Jettie Booze, NP notified of death of 10/02/20   Date and Time of Death - 10-02-20 @ 11:13 am  Pronounced by - RN  History of present illness:   Kathy Turner is a 87 y.o. female with a history of coronary artery disease, dementia,hypertension, diabetes type 2, hypothyroidism, lung cancer/breast cancer  followed by oncology, dementia who became extremely weak at home and was unable to ambulate, was brought to the ER by EMS and found to have COVID-19 infection along with extremely poor oral intake and dehydration.  She was also hypotensive.  Palliative care was involved and she was transition to full comfort care on morphine drip.  She passed away comfortably on 10/02/20 at 11:13 AM pronounced dead by RN.   Final Diagnoses:  Cause if death - Covid 77  Signature  Lala Lund M.D on 10-02-2020 at 11:28 AM  [facility]  Office Phone [phone]  Total clinical and documentation time for today Under 30 minutes   Last Note  Physician Discharge Summary  Kathy Turner TIW:[id number] DOB: June 07, 1932 DOA: 09/24/2020  PCP: Jettie Booze, NP  Admit date: 08/30/2020 Discharge date: 10/02/20  Admitted From: Home Disposition:  Home  Discharge Condition:Stable CODE STATUS:Comfort Care Diet recommendation: Regular    Brief/Interim Summary:  Patient is a 87 year old female  with history of coronary artery disease, dementia,hypertension, diabetes type 2, hypothyroidism, lung cancer/breast cancer  followed by oncology, dementia who presented from home for the evaluation of generalized weakness, low blood pressure.  She was found to be very weak at home, unable to ambulate.  She was also having difficulty finding words and expressing herself.  When her blood pressure was checked at home it was 80/60 so she was brought to the emergency department.  She was having poor oral intake at home.  When EMS arrived, she was severely hypotensive, EKG monitor showed A. fib with RVR, given 10 mg of diltiazem after which her heart rate improved.  On presentation, COVID screen  test was positive.  EKG rhythm on presentation to ED was sinus.  She was found to be hypotensive.  CTA did not show any acute intracranial abnormalities.  Chest x-ray showed mild diffuse interstitial prominence in the lungs.  BNP was 119, troponin was 97.  Patient was started on remdesivir.  She was saturating fine on room air on arrival. Patient was admitted for the management of generalized weakness, COVID.  Palliative care consulted for goals of care discussion due to her multiple comorbidities, history of cancer, dementia. After long discussion with the daughter, we transitioned her care to comfort.  Patient is being planned to be [facility] today.  Discharge Diagnoses:  Principal Problem:   COVID-19 virus infection Active Problems:   Hypotension   Expressive aphasia   Diabetes mellitus type 2 in obese (HCC)   AF (paroxysmal atrial fibrillation) (HCC)   Generalized weakness   History of lung cancer   Dementia (HCC)   AKI (acute kidney injury) (Combine)   Hypokalemia    Discharge Instructions   Allergies as of 18-Oct-2020       Reactions   Copper-containing Compounds Swelling   Other reaction(s): Other Earrings cause redness and drainage   Gabapentin    Other reaction(s):  Other Severe vomiting   Nickel Rash   Codeine    Other reaction(s): Other (See Comments) Unsure of reaction    Ramipril    cough             Allergies  Allergen Reactions   Copper-Containing Compounds Swelling    Other reaction(s): Other Earrings cause redness and drainage   Gabapentin     Other reaction(s): Other Severe vomiting   Nickel Rash   Codeine     Other reaction(s): Other (See Comments) Unsure of reaction    Ramipril     cough    Consultations: Palliative care   Procedures/Studies: CT Head Wo Contrast  Result Date: 09/05/2020 CLINICAL DATA:  Neuro deficit, acute, stroke suspected Syncopal episodes. EXAM: CT HEAD WITHOUT CONTRAST TECHNIQUE: Contiguous axial images were obtained from the base of the skull through the vertex without intravenous contrast. COMPARISON:  None. FINDINGS: Brain: No hemorrhage. No evidence of acute ischemia. Moderate generalized atrophy. Periventricular white matter hypodensity typical of chronic small vessel ischemia, moderate for age. 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 Scattered calcifications in the right cerebral hemisphere suggesting prior infection or inflammatory process. No subdural or extra-axial collection. No midline shift or mass lesion/mass effect. Basilar cisterns are patent. Partially empty sella. Vascular: No hyperdense vessel.  Mild basilar Dolichoectasia. Skull: No fracture or focal lesion. Sinuses/Orbits: Defect of the right lamina papyracea no herniation of fat appears chronic, there is no adjacent inflammation. Bilateral cataract resection. No mastoid effusion. Other: None. IMPRESSION: 1. No acute intracranial abnormality. 2. Generalized atrophy and chronic small vessel ischemia. 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 3. Scattered calcifications in the right cerebral hemisphere suggesting prior infection or inflammatory process. Electronically Signed   By: Keith Rake M.D.    On: 08/29/2020 17:46   MR BRAIN WO CONTRAST  Result Date: 09/28/2020 CLINICAL DATA:  Neuro deficit, acute, stroke suspected. EXAM: MRI HEAD WITHOUT CONTRAST TECHNIQUE: Multiplanar, multiecho pulse sequences of the brain and surrounding structures were obtained without intravenous contrast. COMPARISON:  Head CT yesterday FINDINGS: Brain: Diffusion imaging does not show any acute or subacute infarction. Minor T2 shine through in the left parietal white matter does not represent a true recent  infarction. Chronic small-vessel ischemic changes affect the pons. No focal cerebellar finding. Cerebral hemispheres show chronic small-vessel changes of the thalami, basal ganglia and cerebral hemispheric white matter. Old small right temporal infarction. No mass, hemorrhage, hydrocephalus or extra-axial collection. Vascular: Pronounced dolichoectasia of the vertebrobasilar system. Flow present in both carotid systems. Skull and upper cervical spine: Negative Sinuses/Orbits: Mild mucosal disease of the right ethmoid region. Orbits negative. Other: None IMPRESSION: No acute MRI finding. Age related atrophy. Chronic small-vessel ischemic changes throughout the brain as outlined above. Old small right temporal infarction. Electronically Signed   By: Nelson Chimes M.D.   On: 09/28/2020 11:06   DG Chest Port 1 View  Result Date: 09/14/2020 CLINICAL DATA:  Syncope, shortness of breath EXAM: PORTABLE CHEST 1 VIEW COMPARISON:  None. FINDINGS: Right Port-A-Cath is in place with the tip at the cavoatrial junction. Heart is borderline in size. Diffuse interstitial prominence and perihilar/lower lobe opacities. No effusions. No acute bony abnormality. IMPRESSION: Diffuse interstitial prominence and perihilar opacities could reflect edema or infection. Electronically Signed   By: Rolm Baptise M.D.   On: 09/23/2020 15:18   DG Abd Portable 1V  Result Date: 09/28/2020 CLINICAL DATA:  MRI clearance, loss of consciousness, COVID-19  positive EXAM: PORTABLE ABDOMEN - 1 VIEW COMPARISON:  Portable exam 0928 hours without priors for comparison FINDINGS: Multiple EKG leads project over lower chest and abdomen. No additional metallic foreign bodies identified. Nonobstructive bowel gas pattern. Osseous demineralization with degenerative changes lumbar spine. IMPRESSION: Multiple EKG leads. No additional metallic foreign bodies identified. Electronically Signed   By: Lavonia Dana M.D.   On: 09/28/2020 09:58      Discharge Exam: Vitals:   10/12/20 0000 10/12/20 0942  BP: 101/60 (!) 60/48  Pulse: (!) 118 91  Resp: 14 16  Temp:    SpO2: (!) 76% (!) 87%   Vitals:   09/29/20 0000 09/29/20 0738 10/12/20 0000 10/12/2020 0942  BP: 103/89 (!) 89/50 101/60 (!) 60/48  Pulse: (!) 155 (!) 119 (!) 118 91  Resp:   14 16  Temp:      TempSrc:      SpO2: 92% 92% (!) 76% (!) 87%      The results of significant diagnostics from this hospitalization (including imaging, microbiology, ancillary and laboratory) are listed below for reference.     Microbiology: Recent Results (from the past 240 hour(s))  Culture, blood (routine x 2)     Status: None (Preliminary result)   Collection Time: 08/31/2020  3:18 PM   Specimen: BLOOD  Result Value Ref Range Status   Specimen Description BLOOD SITE NOT SPECIFIED  Final   Special Requests   Final    BOTTLES DRAWN AEROBIC AND ANAEROBIC Blood Culture adequate volume   Culture   Final    NO GROWTH 3 DAYS Performed at [facility], [address] [place], [place] [address]    Report Status PENDING  Incomplete  Culture, blood (routine x 2)     Status: None (Preliminary result)   Collection Time: 09/10/2020  3:23 PM   Specimen: BLOOD  Result Value Ref Range Status   Specimen Description BLOOD SITE NOT SPECIFIED  Final   Special Requests   Final    BOTTLES DRAWN AEROBIC AND ANAEROBIC Blood Culture results may not be optimal due to an excessive volume of blood received in culture bottles    Culture   Final    NO GROWTH 3 DAYS Performed at [facility],  [address], [place], [place] [address]    Report Status PENDING  Incomplete  Resp Panel by RT-PCR (Flu A&B, Covid) Nasopharyngeal Swab     Status: Abnormal   Collection Time: 09/10/2020  3:56 PM   Specimen: Nasopharyngeal Swab; Nasopharyngeal(NP) swabs in vial transport medium  Result Value Ref Range Status   SARS Coronavirus 2 by RT PCR POSITIVE (A) NEGATIVE Final    Comment: RESULT CALLED TO, READ BACK BY AND VERIFIED WITH: RN C.COBB ON [id number] AT 1851 BY E.PARRISH (NOTE) SARS-CoV-2 target nucleic acids are DETECTED.  The SARS-CoV-2 RNA is generally detectable in upper respiratory specimens during the acute phase of infection. Positive results are indicative of the presence of the identified virus, but do not rule out bacterial infection or co-infection with other pathogens not detected by the test. Clinical correlation with patient history and other diagnostic information is necessary to determine patient infection status. The expected result is Negative.  Fact Sheet for Patients: EntrepreneurPulse.com.au  Fact Sheet for Healthcare Providers: IncredibleEmployment.be  This test is not yet approved or cleared by the [place] FDA and  has been authorized for detection and/or diagnosis of SARS-CoV-2 by FDA under an Emergency Use Authorization (EUA).  This EUA will remain in effect (meaning this te st can be used) for the duration of  the COVID-19 declaration under Section 564(b)(1) of the Act, 21 U.S.C. section 360bbb-3(b)(1), unless the authorization is terminated or revoked sooner.     Influenza A by PCR NEGATIVE NEGATIVE Final   Influenza B by PCR NEGATIVE NEGATIVE Final    Comment: (NOTE) The Xpert Xpress SARS-CoV-2/FLU/RSV plus assay is intended as an aid in the diagnosis of influenza from Nasopharyngeal swab specimens and should not be used as a sole basis  for treatment. Nasal washings and aspirates are unacceptable for Xpert Xpress SARS-CoV-2/FLU/RSV testing.  Fact Sheet for Patients: EntrepreneurPulse.com.au  Fact Sheet for Healthcare Providers: IncredibleEmployment.be  This test is not yet approved or cleared by the [place] FDA and has been authorized for detection and/or diagnosis of SARS-CoV-2 by FDA under an Emergency Use Authorization (EUA). This EUA will remain in effect (meaning this test can be used) for the duration of the COVID-19 declaration under Section 564(b)(1) of the Act, 21 U.S.C. section 360bbb-3(b)(1), unless the authorization is terminated or revoked.  Performed at [facility], [place] [address] [place], [place] [address]      Labs: BNP (last 3 results) Recent Labs    09/11/2020 1506  BNP 419.3*   Basic Metabolic Panel: Recent Labs  Lab 09/23/2020 1506 09/26/2020 2020 09/28/20 0240  NA 137  --  140  K 3.3*  --  3.5  CL 109  --  108  CO2 22  --  25  GLUCOSE 111*  --  95  BUN 15  --  11  CREATININE 1.49*  --  1.10*  CALCIUM 7.4*  --  8.0*  MG  --  1.4*  --    Liver Function Tests: Recent Labs  Lab 09/23/2020 1506 09/28/20 0240  AST 16 17  ALT 7 8  ALKPHOS 76 80  BILITOT 0.3 0.2*  PROT 4.7* 5.3*  ALBUMIN 2.5* 2.7*   No results for input(s): LIPASE, AMYLASE in the last 168 hours. No results for input(s): AMMONIA in the last 168 hours. CBC: Recent Labs  Lab 09/26/2020 1506 09/28/20 0433  WBC 2.8* 2.8*  NEUTROABS 1.8  --   HGB 10.3* 11.4*  HCT 32.6* 35.9*  MCV  99.1 98.1  PLT 141* 133*   Cardiac Enzymes: No results for input(s): CKTOTAL, CKMB, CKMBINDEX, TROPONINI in the last 168 hours. BNP: Invalid input(s): POCBNP CBG: Recent Labs  Lab 09/28/20 0755  GLUCAP 92   D-Dimer Recent Labs    09/15/2020 2020 09/28/20 0433  DDIMER 2.05* 2.47*   Hgb A1c No results for input(s): HGBA1C in the last 72 hours. Lipid Profile Recent Labs     08/31/2020 2020  TRIG 119   Thyroid function studies Recent Labs    09/28/20 0233  TSH 3.439   Anemia work up Recent Labs    09/22/2020 2020 09/28/20 0240  FERRITIN 578* 749*   Urinalysis    Component Value Date/Time   COLORURINE YELLOW 09/13/2020 1640   APPEARANCEUR CLOUDY (A) 09/01/2020 1640   LABSPEC 1.017 09/26/2020 1640   PHURINE 5.0 09/16/2020 1640   GLUCOSEU NEGATIVE 09/16/2020 1640   HGBUR NEGATIVE 09/08/2020 1640   BILIRUBINUR NEGATIVE 09/10/2020 1640   KETONESUR NEGATIVE 09/16/2020 1640   PROTEINUR NEGATIVE 09/04/2020 1640   NITRITE NEGATIVE 09/22/2020 1640   LEUKOCYTESUR SMALL (A) 09/17/2020 1640   Sepsis Labs Invalid input(s): PROCALCITONIN,  WBC,  LACTICIDVEN Microbiology Recent Results (from the past 240 hour(s))  Culture, blood (routine x 2)     Status: None (Preliminary result)   Collection Time: 09/05/2020  3:18 PM   Specimen: BLOOD  Result Value Ref Range Status   Specimen Description BLOOD SITE NOT SPECIFIED  Final   Special Requests   Final    BOTTLES DRAWN AEROBIC AND ANAEROBIC Blood Culture adequate volume   Culture   Final    NO GROWTH 3 DAYS Performed at [facility], [address], [place], [place] [address]    Report Status PENDING  Incomplete  Culture, blood (routine x 2)     Status: None (Preliminary result)   Collection Time: 09/18/2020  3:23 PM   Specimen: BLOOD  Result Value Ref Range Status   Specimen Description BLOOD SITE NOT SPECIFIED  Final   Special Requests   Final    BOTTLES DRAWN AEROBIC AND ANAEROBIC Blood Culture results may not be optimal due to an excessive volume of blood received in culture bottles   Culture   Final    NO GROWTH 3 DAYS Performed at [facility], [place] [address] [place], [place] [address]    Report Status PENDING  Incomplete  Resp Panel by RT-PCR (Flu A&B, Covid) Nasopharyngeal Swab     Status: Abnormal   Collection Time: 08/29/2020  3:56 PM   Specimen: Nasopharyngeal Swab;  Nasopharyngeal(NP) swabs in vial transport medium  Result Value Ref Range Status   SARS Coronavirus 2 by RT PCR POSITIVE (A) NEGATIVE Final    Comment: RESULT CALLED TO, READ BACK BY AND VERIFIED WITH: RN C.COBB ON [id number] AT 1851 BY E.PARRISH (NOTE) SARS-CoV-2 target nucleic acids are DETECTED.  The SARS-CoV-2 RNA is generally detectable in upper respiratory specimens during the acute phase of infection. Positive results are indicative of the presence of the identified virus, but do not rule out bacterial infection or co-infection with other pathogens not detected by the test. Clinical correlation with patient history and other diagnostic information is necessary to determine patient infection status. The expected result is Negative.  Fact Sheet for Patients: EntrepreneurPulse.com.au  Fact Sheet for Healthcare Providers: IncredibleEmployment.be  This test is not yet approved or cleared by the [place] FDA and  has been authorized for detection and/or diagnosis of SARS-CoV-2 by  FDA under an Emergency Use Authorization (EUA).  This EUA will remain in effect (meaning this te st can be used) for the duration of  the COVID-19 declaration under Section 564(b)(1) of the Act, 21 U.S.C. section 360bbb-3(b)(1), unless the authorization is terminated or revoked sooner.     Influenza A by PCR NEGATIVE NEGATIVE Final   Influenza B by PCR NEGATIVE NEGATIVE Final    Comment: (NOTE) The Xpert Xpress SARS-CoV-2/FLU/RSV plus assay is intended as an aid in the diagnosis of influenza from Nasopharyngeal swab specimens and should not be used as a sole basis for treatment. Nasal washings and aspirates are unacceptable for Xpert Xpress SARS-CoV-2/FLU/RSV testing.  Fact Sheet for Patients: EntrepreneurPulse.com.au  Fact Sheet for Healthcare Providers: IncredibleEmployment.be  This test is not yet approved or cleared by  the [place] FDA and has been authorized for detection and/or diagnosis of SARS-CoV-2 by FDA under an Emergency Use Authorization (EUA). This EUA will remain in effect (meaning this test can be used) for the duration of the COVID-19 declaration under Section 564(b)(1) of the Act, 21 U.S.C. section 360bbb-3(b)(1), unless the authorization is terminated or revoked.  Performed at [facility], [place] [address], [place], [place] [address]     Please note:  You were cared for by a hospitalist during your hospital stay. Once you are discharged, your primary care physician will handle any further medical issues. Please note that NO REFILLS for any discharge medications will be authorized once you are discharged, as it is imperative that you return to your primary care physician (or establish a relationship with a primary care physician if you do not have one) for your post hospital discharge needs so that they can reassess your need for medications and monitor your lab values.   Time coordinating discharge: 40 minutes

## 2020-10-29 NOTE — Progress Notes (Signed)
Called to patient's room by family member.  Color grey.  Respirations ceased.  No audible or palpable pulse noted.  Second nurse verifier = Cyril Mourning, Warehouse manager.  MD notified.  All immediate family present.

## 2020-10-29 NOTE — Progress Notes (Signed)
Body transferred to morgue accompanied by 2 nurse techs.

## 2020-10-29 NOTE — Progress Notes (Signed)
Nutrition Brief Note  Chart reviewed. Pt now transitioning to comfort care.  No further nutrition interventions planned at this time.  Please re-consult as needed.   Kayonna Lawniczak W, RD, LDN, CDCES Registered Dietitian II Certified Diabetes Care and Education Specialist Please refer to AMION for RD and/or RD on-call/weekend/after hours pager   

## 2020-10-29 DEATH — deceased

## 2021-12-28 IMAGING — CT CT HEAD W/O CM
4 series · 15 of 47 positions shown, 17 images · non-contrast
Comparison: None.

CLINICAL DATA: Neuro deficit, acute, stroke suspected

Syncopal episodes.
EXAM:
CT HEAD WITHOUT CONTRAST
TECHNIQUE: Contiguous axial images were obtained from the base of the skull
through the vertex without intravenous contrast.

[Series 3: head wo · axial · 0.42mm/px · z∈[+1228,+1348]mm · 7 of 32 slices shown, 9 images]
[im 4/32  brain]
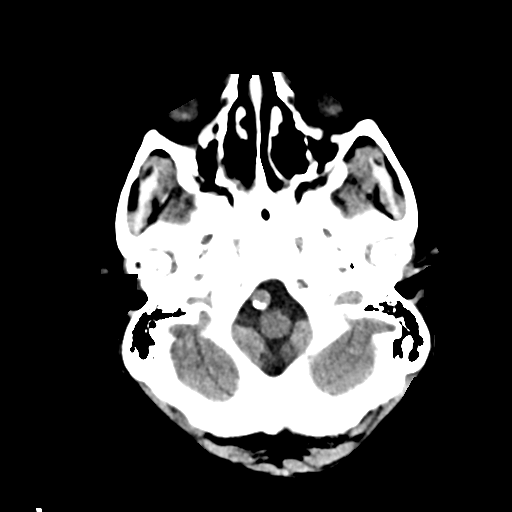
[im 4/32  bone]
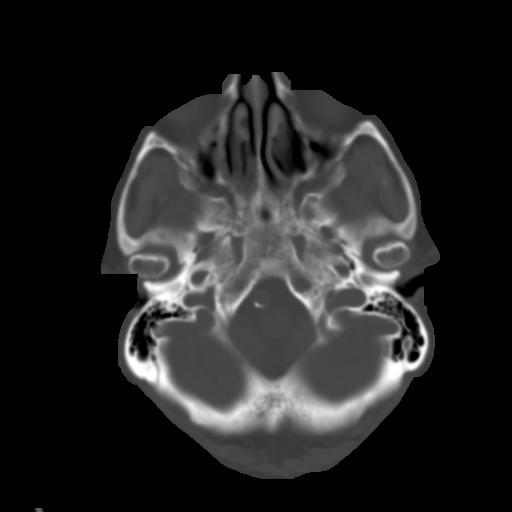
[im 8/32  brain]
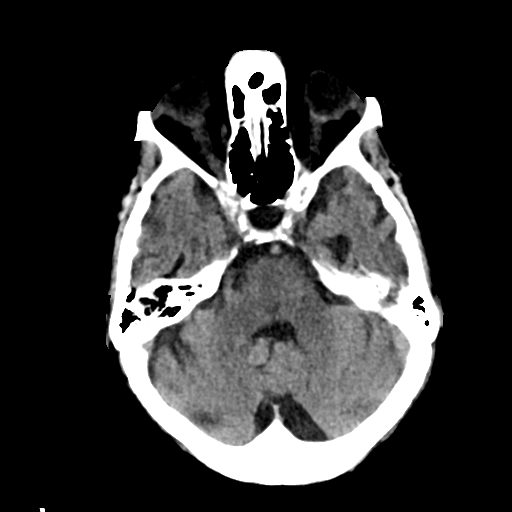
[im 12/32  brain]
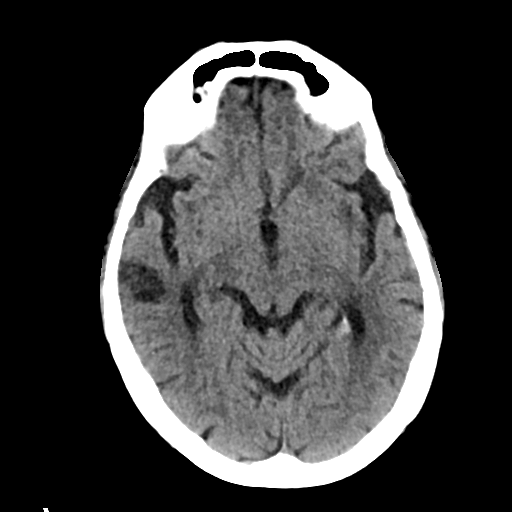
[im 16/32  brain]
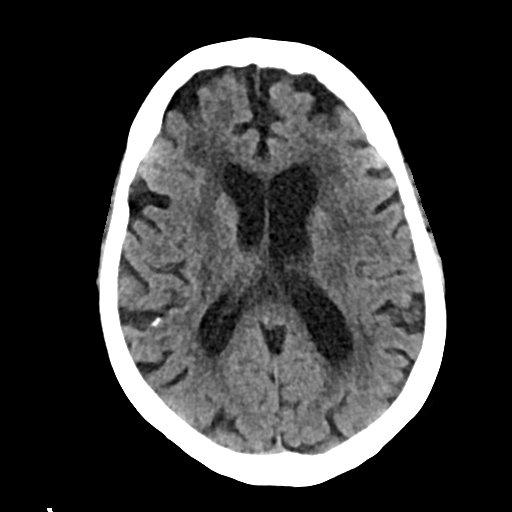
[im 20/32  brain]
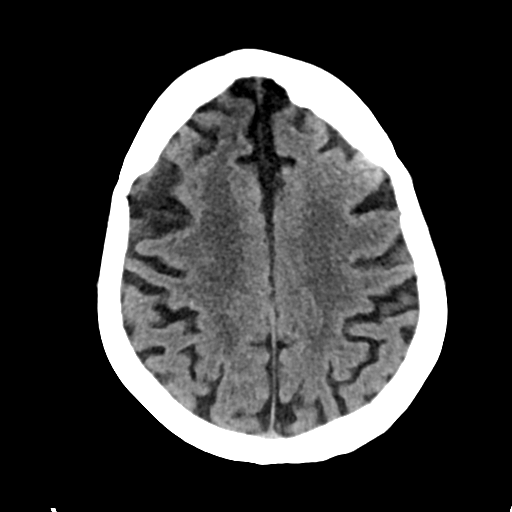
[im 20/32  bone]
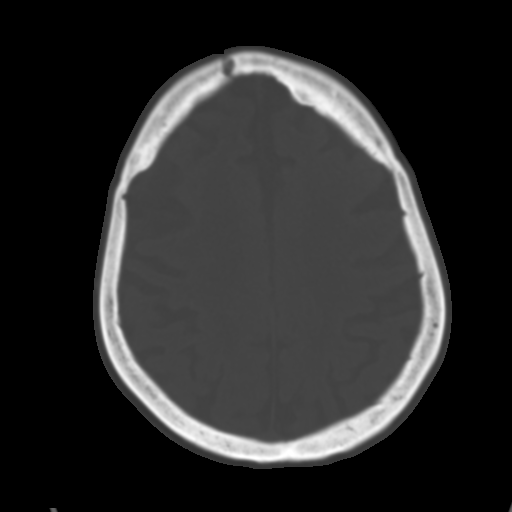
[im 24/32  brain]
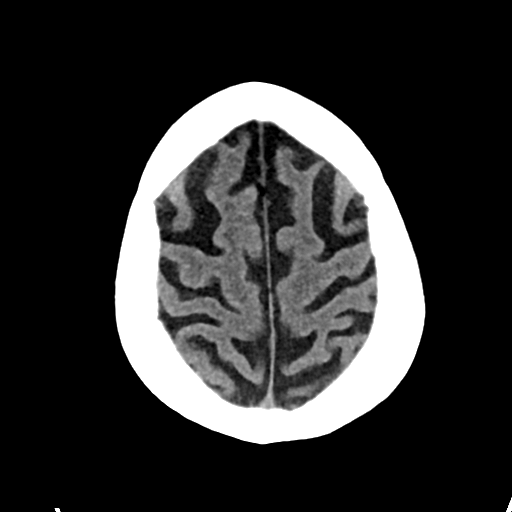
[im 28/32  brain]
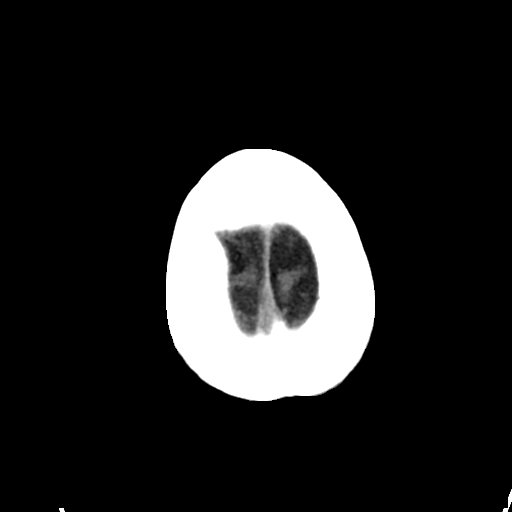

[Series 4: head bone · axial · 0.42mm/px · z∈[+1227,+1243]mm · 2 of 80 slices shown]
[im 8/80  bone]
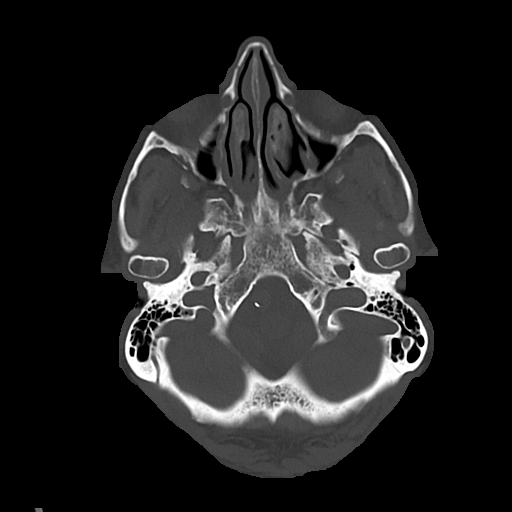
[im 16/80  bone]
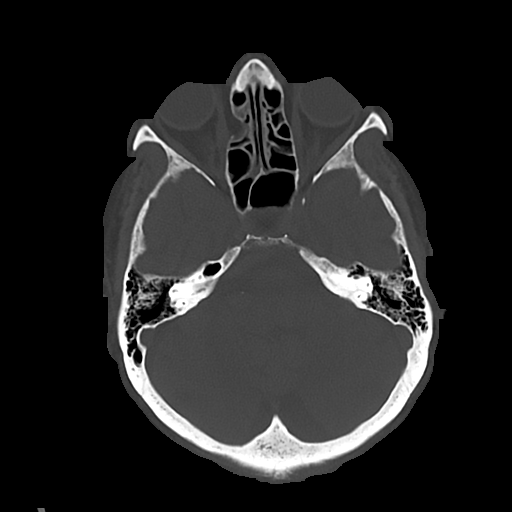

[Series 5: cor soft · coronal · 0.29mm/px · 3 of 70 slices shown]
[im 24/70  brain]
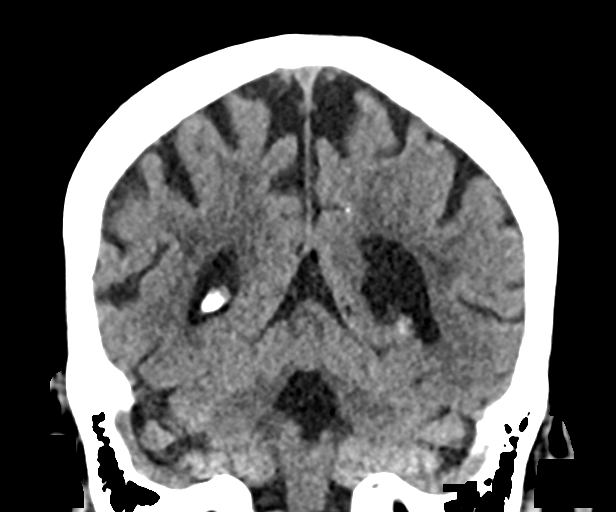
[im 31/70  brain]
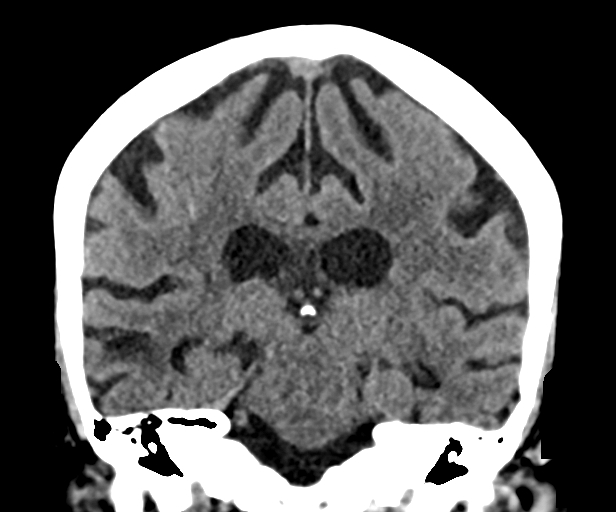
[im 39/70  brain]
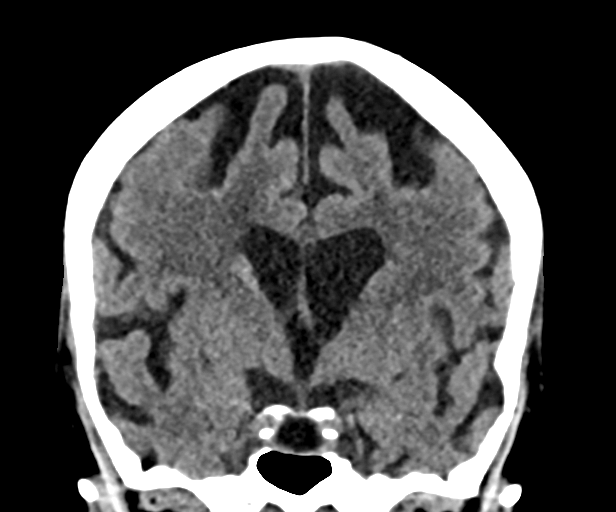

[Series 6: sag soft · sagittal · 0.29mm/px · 3 of 56 slices shown]
[im 19/56  brain]
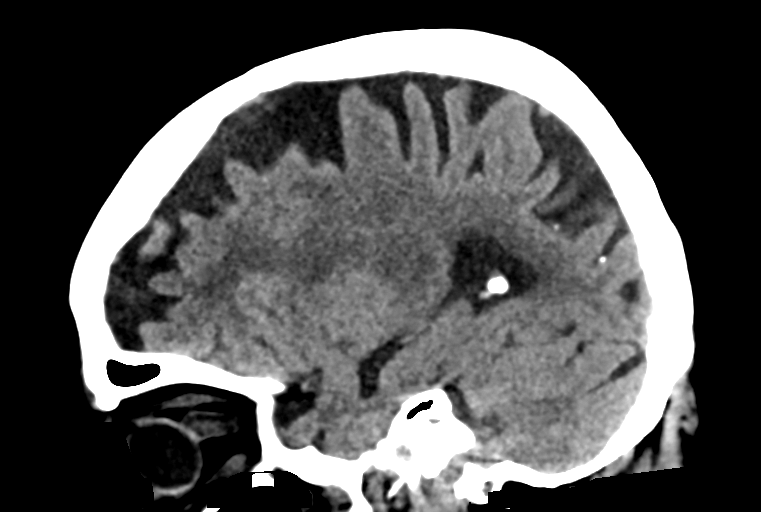
[im 28/56  brain]
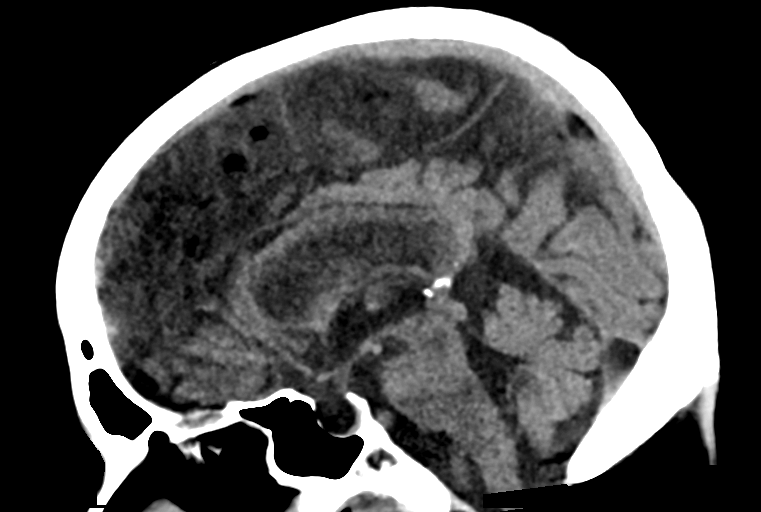
[im 37/56  brain]
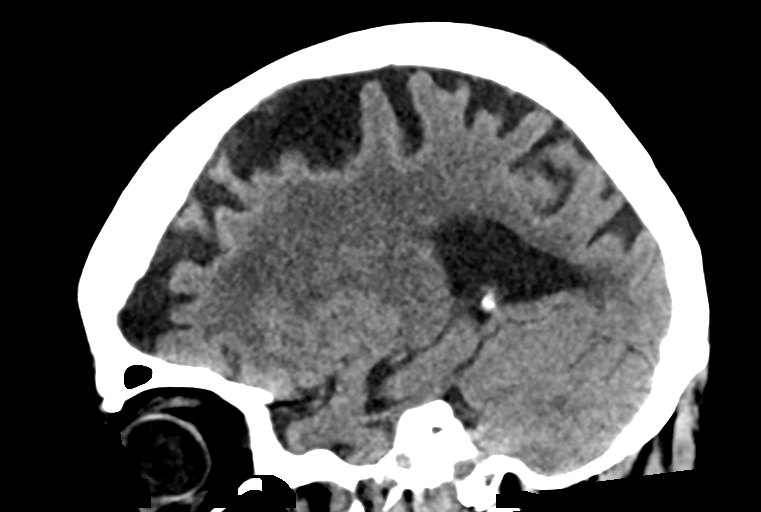

[15 of 47 positions shown; findings below may reference images not displayed]

FINDINGS: Brain: No hemorrhage. No evidence of acute ischemia. Moderate
generalized atrophy. Periventricular white matter hypodensity
typical of chronic small vessel ischemia, moderate for age. Small
remote cortical infarct in the right temporal lobe. Scattered
calcifications in the right cerebral hemisphere suggesting prior
infection or inflammatory process. No subdural or extra-axial
collection. No midline shift or mass lesion/mass effect. Basilar
cisterns are patent. Partially empty sella.

Vascular: No hyperdense vessel.  Mild basilar Dolichoectasia.

Skull: No fracture or focal lesion.

Sinuses/Orbits: Defect of the right lamina papyracea no herniation
of fat appears chronic, there is no adjacent inflammation. Bilateral
cataract resection. No mastoid effusion.

Other: None.
IMPRESSION: 1. No acute intracranial abnormality.
2. Generalized atrophy and chronic small vessel ischemia. Small
remote cortical infarct in the right temporal lobe.
3. Scattered calcifications in the right cerebral hemisphere
suggesting prior infection or inflammatory process.
